# Patient Record
Sex: Female | Born: 1955 | Race: White | Hispanic: No | Smoking: Former smoker
Health system: Southern US, Community
[De-identification: ages and names within clinical notes are randomized; demographics above are authoritative.]

## PROBLEM LIST (undated history)

## (undated) DIAGNOSIS — I351 Nonrheumatic aortic (valve) insufficiency: Secondary | ICD-10-CM

## (undated) DIAGNOSIS — E039 Hypothyroidism, unspecified: Secondary | ICD-10-CM

## (undated) DIAGNOSIS — Z951 Presence of aortocoronary bypass graft: Secondary | ICD-10-CM

## (undated) DIAGNOSIS — K5190 Ulcerative colitis, unspecified, without complications: Secondary | ICD-10-CM

## (undated) DIAGNOSIS — I10 Essential (primary) hypertension: Secondary | ICD-10-CM

## (undated) DIAGNOSIS — E119 Type 2 diabetes mellitus without complications: Secondary | ICD-10-CM

## (undated) DIAGNOSIS — E785 Hyperlipidemia, unspecified: Secondary | ICD-10-CM

## (undated) DIAGNOSIS — I2510 Atherosclerotic heart disease of native coronary artery without angina pectoris: Secondary | ICD-10-CM

## (undated) HISTORY — PX: FRACTURE SURGERY: SHX138

## (undated) HISTORY — DX: Essential (primary) hypertension: I10

## (undated) HISTORY — DX: Type 2 diabetes mellitus without complications: E11.9

## (undated) HISTORY — PX: TONSILLECTOMY: SUR1361

## (undated) HISTORY — DX: Hyperlipidemia, unspecified: E78.5

## (undated) HISTORY — PX: OTHER SURGICAL HISTORY: SHX169

---
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
## 1993-02-06 HISTORY — PX: TUBAL LIGATION: SHX77

## 2016-12-10 ENCOUNTER — Encounter: Payer: Self-pay | Admitting: Family Medicine

## 2016-12-10 ENCOUNTER — Ambulatory Visit (INDEPENDENT_AMBULATORY_CARE_PROVIDER_SITE_OTHER): Payer: Medicare Other | Admitting: Emergency Medicine

## 2016-12-10 VITALS — BP 132/66 | HR 69 | Temp 98.8°F | Resp 16 | Ht 68.75 in | Wt 147.0 lb

## 2016-12-10 DIAGNOSIS — Z0000 Encounter for general adult medical examination without abnormal findings: Secondary | ICD-10-CM | POA: Diagnosis not present

## 2016-12-10 DIAGNOSIS — Z8639 Personal history of other endocrine, nutritional and metabolic disease: Secondary | ICD-10-CM

## 2016-12-10 DIAGNOSIS — I10 Essential (primary) hypertension: Secondary | ICD-10-CM

## 2016-12-10 DIAGNOSIS — E785 Hyperlipidemia, unspecified: Secondary | ICD-10-CM

## 2016-12-10 NOTE — Progress Notes (Signed)
[name] 61 y.o.   Chief Complaint  Patient presents with  . Establish Care    HISTORY OF PRESENT ILLNESS: This is a 61 y.o. female here to establish care. Chronic smoker; states her diabetes and hypertension are under control on no meds; was morbidly obese at one time (425 lbs) and was able to lose significant amount; has chronic back pain and was on chronic opiates for some time but off them now for 4-5 years. Has FHx of Colon CA (mother) and has had 3 colonoscopies when polys were removed; no h/o breast CA and has kept UTD with mammograms. Has no complaints or medical concerns; requesting referral to Plastic Surgeon for excess skin evaluation.  HPI   Prior to Admission medications   Not on File    Allergies not on file  There are no active problems to display for this patient.   Past Medical History:  Diagnosis Date  . Diabetes mellitus without complication ([place])   . Hyperlipidemia   . Hypertension     Past Surgical History:  Procedure Laterality Date  . FRACTURE SURGERY Right    thumb  . head surgery Left   . TONSILLECTOMY    . TUBAL LIGATION  02/1993    Social History   Social History  . Marital status: Legally Separated    Spouse name: N/A  . Number of children: N/A  . Years of education: N/A   Occupational History  . Not on file.   Social History Main Topics  . Smoking status: Current Every Day Smoker  . Smokeless tobacco: Never Used  . Alcohol use No  . Drug use: No  . Sexual activity: Not on file   Other Topics Concern  . Not on file   Social History Narrative  . No narrative on file    Family History  Problem Relation Age of Onset  . Cancer Mother        colon and bone     Review of Systems  Constitutional: Positive for weight loss (planned). Negative for chills, fever and malaise/fatigue.  HENT: Negative.  Negative for hearing loss and nosebleeds.   Eyes: Negative.  Negative for blurred vision, double vision, discharge and  redness.  Respiratory: Negative.  Negative for cough and shortness of breath.   Cardiovascular: Negative.  Negative for chest pain, claudication and leg swelling.  Gastrointestinal: Negative.  Negative for abdominal pain, diarrhea, nausea and vomiting.  Genitourinary: Negative.  Negative for dysuria and hematuria.  Musculoskeletal: Positive for back pain (chronic) and neck pain. Negative for myalgias.  Skin: Negative for rash.  Neurological: Negative.  Negative for dizziness, sensory change, focal weakness and headaches.  Endo/Heme/Allergies: Negative.   All other systems reviewed and are negative.  Vitals:   12/10/16 1351 12/10/16 1358  BP: (!) 146/78 132/66  Pulse: 69   Resp: 16   Temp: 98.8 F (37.1 C)   SpO2: 100%      Physical Exam  Constitutional: She is oriented to person, place, and time. She appears well-developed and well-nourished.  HENT:  Head: Normocephalic and atraumatic.  Right Ear: External ear normal.  Left Ear: External ear normal.  Nose: Nose normal.  Mouth/Throat: Oropharynx is clear and moist.  Eyes: Pupils are equal, round, and reactive to light. Conjunctivae and EOM are normal.  Neck: Normal range of motion. Neck supple. No JVD present. No thyromegaly present.  Cardiovascular: Normal rate, regular rhythm, normal heart sounds and intact distal pulses.   Pulmonary/Chest: Effort normal  and breath sounds normal.  Abdominal: Soft. Bowel sounds are normal. She exhibits no distension. There is no tenderness.  Musculoskeletal: Normal range of motion.  Lymphadenopathy:    She has no cervical adenopathy.  Neurological: She is alert and oriented to person, place, and time. No sensory deficit. She exhibits normal muscle tone.  Skin: Skin is warm and dry. Capillary refill takes less than 2 seconds. No rash noted.  Psychiatric: She has a normal mood and affect. Her behavior is normal.     ASSESSMENT & PLAN:  Ameriah was seen today for establish care.  Diagnoses  and all orders for this visit:  Routine general medical examination at a health care facility -     CBC with Differential -     Comprehensive metabolic panel -     TSH -     Lipid panel -     Hepatitis C antibody screen -     Ambulatory referral to Plastic Surgery   Patient Instructions       IF you received an x-ray today, you will receive an invoice from [facility]. Please contact [facility] at [phone] with questions or concerns regarding your invoice.   IF you received labwork today, you will receive an invoice from [place]. Please contact [facility] at [phone] with questions or concerns regarding your invoice.   Our billing staff will not be able to assist you with questions regarding bills from these companies.  You will be contacted with the lab results as soon as they are available. The fastest way to get your results is to activate your My Chart account. Instructions are located on the last page of this paperwork. If you have not heard from [place] regarding the results in 2 weeks, please contact this office.      Health Maintenance, Female Adopting a healthy lifestyle and getting preventive care can go a long way to promote health and wellness. Talk with your health care provider about what schedule of regular examinations is right for you. This is a good chance for you to check in with your provider about disease prevention and staying healthy. In between checkups, there are plenty of things you can do on your own. Experts have done a lot of research about which lifestyle changes and preventive measures are most likely to keep you healthy. Ask your health care provider for more information. Weight and diet Eat a healthy diet  Be sure to include plenty of vegetables, fruits, low-fat dairy products, and lean protein.  Do not eat a lot of foods high in solid fats, added sugars, or salt.  Get regular exercise. This is one of the most important  things you can do for your health. ? Most adults should exercise for at least 150 minutes each week. The exercise should increase your heart rate and make you sweat (moderate-intensity exercise). ? Most adults should also do strengthening exercises at least twice a week. This is in addition to the moderate-intensity exercise.  Maintain a healthy weight  Body mass index (BMI) is a measurement that can be used to identify possible weight problems. It estimates body fat based on height and weight. Your health care provider can help determine your BMI and help you achieve or maintain a healthy weight.  For females 8 years of age and older: ? A BMI below 18.5 is considered underweight. ? A BMI of 18.5 to 24.9 is normal. ? A BMI of 25 to 29.9 is considered overweight. ? A BMI  of 30 and above is considered obese.  Watch levels of cholesterol and blood lipids  You should start having your blood tested for lipids and cholesterol at 59 years of age, then have this test every 5 years.  You may need to have your cholesterol levels checked more often if: ? Your lipid or cholesterol levels are high. ? You are older than 59 years of age. ? You are at high risk for heart disease.  Cancer screening Lung Cancer  Lung cancer screening is recommended for adults 80-61 years old who are at high risk for lung cancer because of a history of smoking.  A yearly low-dose CT scan of the lungs is recommended for people who: ? Currently smoke. ? Have quit within the past 15 years. ? Have at least a 30-pack-year history of smoking. A pack year is smoking an average of one pack of cigarettes a day for 1 year.  Yearly screening should continue until it has been 15 years since you quit.  Yearly screening should stop if you develop a health problem that would prevent you from having lung cancer treatment.  Breast Cancer  Practice breast self-awareness. This means understanding how your breasts normally appear  and feel.  It also means doing regular breast self-exams. Let your health care provider know about any changes, no matter how small.  If you are in your 20s or 30s, you should have a clinical breast exam (CBE) by a health care provider every 1-3 years as part of a regular health exam.  If you are 66 or older, have a CBE every year. Also consider having a breast X-ray (mammogram) every year.  If you have a family history of breast cancer, talk to your health care provider about genetic screening.  If you are at high risk for breast cancer, talk to your health care provider about having an MRI and a mammogram every year.  Breast cancer gene (BRCA) assessment is recommended for women who have family members with BRCA-related cancers. BRCA-related cancers include: ? Breast. ? Ovarian. ? Tubal. ? Peritoneal cancers.  Results of the assessment will determine the need for genetic counseling and BRCA1 and BRCA2 testing.  Cervical Cancer Your health care provider may recommend that you be screened regularly for cancer of the pelvic organs (ovaries, uterus, and vagina). This screening involves a pelvic examination, including checking for microscopic changes to the surface of your cervix (Pap test). You may be encouraged to have this screening done every 3 years, beginning at age 34.  For women ages 58-65, health care providers may recommend pelvic exams and Pap testing every 3 years, or they may recommend the Pap and pelvic exam, combined with testing for human papilloma virus (HPV), every 5 years. Some types of HPV increase your risk of cervical cancer. Testing for HPV may also be done on women of any age with unclear Pap test results.  Other health care providers may not recommend any screening for nonpregnant women who are considered low risk for pelvic cancer and who do not have symptoms. Ask your health care provider if a screening pelvic exam is right for you.  If you have had past treatment  for cervical cancer or a condition that could lead to cancer, you need Pap tests and screening for cancer for at least 20 years after your treatment. If Pap tests have been discontinued, your risk factors (such as having a new sexual partner) need to be reassessed to determine if screening should  resume. Some women have medical problems that increase the chance of getting cervical cancer. In these cases, your health care provider may recommend more frequent screening and Pap tests.  Colorectal Cancer  This type of cancer can be detected and often prevented.  Routine colorectal cancer screening usually begins at 59 years of age and continues through 59 years of age.  Your health care provider may recommend screening at an earlier age if you have risk factors for colon cancer.  Your health care provider may also recommend using home test kits to check for hidden blood in the stool.  A small camera at the end of a tube can be used to examine your colon directly (sigmoidoscopy or colonoscopy). This is done to check for the earliest forms of colorectal cancer.  Routine screening usually begins at age 74.  Direct examination of the colon should be repeated every 5-10 years through 59 years of age. However, you may need to be screened more often if early forms of precancerous polyps or small growths are found.  Skin Cancer  Check your skin from head to toe regularly.  Tell your health care provider about any new moles or changes in moles, especially if there is a change in a mole's shape or color.  Also tell your health care provider if you have a mole that is larger than the size of a pencil eraser.  Always use sunscreen. Apply sunscreen liberally and repeatedly throughout the day.  Protect yourself by wearing long sleeves, pants, a wide-brimmed hat, and sunglasses whenever you are outside.  Heart disease, diabetes, and high blood pressure  High blood pressure causes heart disease and  increases the risk of stroke. High blood pressure is more likely to develop in: ? People who have blood pressure in the high end of the normal range (130-139/85-89 mm Hg). ? People who are overweight or obese. ? People who are African American.  If you are 65-38 years of age, have your blood pressure checked every 3-5 years. If you are 10 years of age or older, have your blood pressure checked every year. You should have your blood pressure measured twice-once when you are at a hospital or clinic, and once when you are not at a hospital or clinic. Record the average of the two measurements. To check your blood pressure when you are not at a hospital or clinic, you can use: ? An automated blood pressure machine at a pharmacy. ? A home blood pressure monitor.  If you are between 67 years and 31 years old, ask your health care provider if you should take aspirin to prevent strokes.  Have regular diabetes screenings. This involves taking a blood sample to check your fasting blood sugar level. ? If you are at a normal weight and have a low risk for diabetes, have this test once every three years after 59 years of age. ? If you are overweight and have a high risk for diabetes, consider being tested at a younger age or more often. Preventing infection Hepatitis B  If you have a higher risk for hepatitis B, you should be screened for this virus. You are considered at high risk for hepatitis B if: ? You were born in a country where hepatitis B is common. Ask your health care provider which countries are considered high risk. ? Your parents were born in a high-risk country, and you have not been immunized against hepatitis B (hepatitis B vaccine). ? You have HIV or AIDS. ?  You use needles to inject street drugs. ? You live with someone who has hepatitis B. ? You have had sex with someone who has hepatitis B. ? You get hemodialysis treatment. ? You take certain medicines for conditions, including  cancer, organ transplantation, and autoimmune conditions.  Hepatitis C  Blood testing is recommended for: ? Everyone born from [birth year] through 1963. ? Anyone with known risk factors for hepatitis C.  Sexually transmitted infections (STIs)  You should be screened for sexually transmitted infections (STIs) including gonorrhea and chlamydia if: ? You are sexually active and are younger than 59 years of age. ? You are older than 59 years of age and your health care provider tells you that you are at risk for this type of infection. ? Your sexual activity has changed since you were last screened and you are at an increased risk for chlamydia or gonorrhea. Ask your health care provider if you are at risk.  If you do not have HIV, but are at risk, it may be recommended that you take a prescription medicine daily to prevent HIV infection. This is called pre-exposure prophylaxis (PrEP). You are considered at risk if: ? You are sexually active and do not regularly use condoms or know the HIV status of your partner(s). ? You take drugs by injection. ? You are sexually active with a partner who has HIV.  Talk with your health care provider about whether you are at high risk of being infected with HIV. If you choose to begin PrEP, you should first be tested for HIV. You should then be tested every 3 months for as long as you are taking PrEP. Pregnancy  If you are premenopausal and you may become pregnant, ask your health care provider about preconception counseling.  If you may become pregnant, take 400 to 800 micrograms (mcg) of folic acid every day.  If you want to prevent pregnancy, talk to your health care provider about birth control (contraception). Osteoporosis and menopause  Osteoporosis is a disease in which the bones lose minerals and strength with aging. This can result in serious bone fractures. Your risk for osteoporosis can be identified using a bone density scan.  If you are 36 years  of age or older, or if you are at risk for osteoporosis and fractures, ask your health care provider if you should be screened.  Ask your health care provider whether you should take a calcium or vitamin D supplement to lower your risk for osteoporosis.  Menopause may have certain physical symptoms and risks.  Hormone replacement therapy may reduce some of these symptoms and risks. Talk to your health care provider about whether hormone replacement therapy is right for you. Follow these instructions at home:  Schedule regular health, dental, and eye exams.  Stay current with your immunizations.  Do not use any tobacco products including cigarettes, chewing tobacco, or electronic cigarettes.  If you are pregnant, do not drink alcohol.  If you are breastfeeding, limit how much and how often you drink alcohol.  Limit alcohol intake to no more than 1 drink per day for nonpregnant women. One drink equals 12 ounces of beer, 5 ounces of wine, or 1 ounces of hard liquor.  Do not use street drugs.  Do not share needles.  Ask your health care provider for help if you need support or information about quitting drugs.  Tell your health care provider if you often feel depressed.  Tell your health care provider if you have  ever been abused or do not feel safe at home. This information is not intended to replace advice given to you by your health care provider. Make sure you discuss any questions you have with your health care provider. Document Released: 10/08/2010 Document Revised: 08/31/2015 Document Reviewed: 12/27/2014 Elsevier Interactive Patient Education  2018 [place] (AHA) Exercise Recommendation  Being physically active is important to prevent heart disease and stroke, the nation's No. 1and No. 5killers. To improve overall cardiovascular health, we suggest at least 150 minutes per week of moderate exercise or 75 minutes per week of vigorous exercise  (or a combination of moderate and vigorous activity). Thirty minutes a day, five times a week is an easy goal to remember. You will also experience benefits even if you divide your time into two or three segments of 10 to 15 minutes per day.  For people who would benefit from lowering their blood pressure or cholesterol, we recommend 40 minutes of aerobic exercise of moderate to vigorous intensity three to four times a week to lower the risk for heart attack and stroke.  Physical activity is anything that makes you move your body and burn calories.  This includes things like climbing stairs or playing sports. Aerobic exercises benefit your heart, and include walking, jogging, swimming or biking. Strength and stretching exercises are best for overall stamina and flexibility.  The simplest, positive change you can make to effectively improve your heart health is to start walking. It's enjoyable, free, easy, social and great exercise. A walking program is flexible and boasts high success rates because people can stick with it. It's easy for walking to become a regular and satisfying part of life.   For Overall Cardiovascular Health:  At least 30 minutes of moderate-intensity aerobic activity at least 5 days per week for a total of 150  OR   At least 25 minutes of vigorous aerobic activity at least 3 days per week for a total of 75 minutes; or a combination of moderate- and vigorous-intensity aerobic activity  AND   Moderate- to high-intensity muscle-strengthening activity at least 2 days per week for additional health benefits.  For Lowering Blood Pressure and Cholesterol  An average 40 minutes of moderate- to vigorous-intensity aerobic activity 3 or 4 times per week  What if I can't make it to the time goal? Something is always better than nothing! And everyone has to start somewhere. Even if you've been sedentary for years, today is the day you can begin to make healthy changes in your  life. If you don't think you'll make it for 30 or 40 minutes, set a reachable goal for today. You can work up toward your overall goal by increasing your time as you get stronger. Don't let all-or-nothing thinking rob you of doing what you can every day.  Source:[email or web address].[email or web address] [email or web address] MD [facility]

## 2016-12-10 NOTE — Patient Instructions (Addendum)
   IF you received an x-ray today, you will receive an invoice from [facility]. Please contact [facility] at [phone] with questions or concerns regarding your invoice.   IF you received labwork today, you will receive an invoice from [facility]. Please contact [facility] at [phone] with questions or concerns regarding your invoice.   Our billing staff will not be able to assist you with questions regarding bills from these companies.  You will be contacted with the lab results as soon as they are available. The fastest way to get your results is to activate your My Chart account. Instructions are located on the last page of this paperwork. If you have not heard from us regarding the results in 2 weeks, please contact this office.     Health Maintenance, Female Adopting a healthy lifestyle and getting preventive care can go a long way to promote health and wellness. Talk with your health care provider about what schedule of regular examinations is right for you. This is a good chance for you to check in with your provider about disease prevention and staying healthy. In between checkups, there are plenty of things you can do on your own. Experts have done a lot of research about which lifestyle changes and preventive measures are most likely to keep you healthy. Ask your health care provider for more information. Weight and diet Eat a healthy diet  Be sure to include plenty of vegetables, fruits, low-fat dairy products, and lean protein.  Do not eat a lot of foods high in solid fats, added sugars, or salt.  Get regular exercise. This is one of the most important things you can do for your health. ? Most adults should exercise for at least 150 minutes each week. The exercise should increase your heart rate and make you sweat (moderate-intensity exercise). ? Most adults should also do strengthening exercises at least twice a week. This is in addition to the  moderate-intensity exercise.  Maintain a healthy weight  Body mass index (BMI) is a measurement that can be used to identify possible weight problems. It estimates body fat based on height and weight. Your health care provider can help determine your BMI and help you achieve or maintain a healthy weight.  For females 20 years of age and older: ? A BMI below 18.5 is considered underweight. ? A BMI of 18.5 to 24.9 is normal. ? A BMI of 25 to 29.9 is considered overweight. ? A BMI of 30 and above is considered obese.  Watch levels of cholesterol and blood lipids  You should start having your blood tested for lipids and cholesterol at 59 years of age, then have this test every 5 years.  You may need to have your cholesterol levels checked more often if: ? Your lipid or cholesterol levels are high. ? You are older than 59 years of age. ? You are at high risk for heart disease.  Cancer screening Lung Cancer  Lung cancer screening is recommended for adults 55-80 years old who are at high risk for lung cancer because of a history of smoking.  A yearly low-dose CT scan of the lungs is recommended for people who: ? Currently smoke. ? Have quit within the past 15 years. ? Have at least a 30-pack-year history of smoking. A pack year is smoking an average of one pack of cigarettes a day for 1 year.  Yearly screening should continue until it has been 15 years since you quit.  Yearly   screening should stop if you develop a health problem that would prevent you from having lung cancer treatment.  Breast Cancer  Practice breast self-awareness. This means understanding how your breasts normally appear and feel.  It also means doing regular breast self-exams. Let your health care provider know about any changes, no matter how small.  If you are in your 20s or 30s, you should have a clinical breast exam (CBE) by a health care provider every 1-3 years as part of a regular health exam.  If you  are 40 or older, have a CBE every year. Also consider having a breast X-ray (mammogram) every year.  If you have a family history of breast cancer, talk to your health care provider about genetic screening.  If you are at high risk for breast cancer, talk to your health care provider about having an MRI and a mammogram every year.  Breast cancer gene (BRCA) assessment is recommended for women who have family members with BRCA-related cancers. BRCA-related cancers include: ? Breast. ? Ovarian. ? Tubal. ? Peritoneal cancers.  Results of the assessment will determine the need for genetic counseling and BRCA1 and BRCA2 testing.  Cervical Cancer Your health care provider may recommend that you be screened regularly for cancer of the pelvic organs (ovaries, uterus, and vagina). This screening involves a pelvic examination, including checking for microscopic changes to the surface of your cervix (Pap test). You may be encouraged to have this screening done every 3 years, beginning at age 21.  For women ages 30-65, health care providers may recommend pelvic exams and Pap testing every 3 years, or they may recommend the Pap and pelvic exam, combined with testing for human papilloma virus (HPV), every 5 years. Some types of HPV increase your risk of cervical cancer. Testing for HPV may also be done on women of any age with unclear Pap test results.  Other health care providers may not recommend any screening for nonpregnant women who are considered low risk for pelvic cancer and who do not have symptoms. Ask your health care provider if a screening pelvic exam is right for you.  If you have had past treatment for cervical cancer or a condition that could lead to cancer, you need Pap tests and screening for cancer for at least 20 years after your treatment. If Pap tests have been discontinued, your risk factors (such as having a new sexual partner) need to be reassessed to determine if screening should  resume. Some women have medical problems that increase the chance of getting cervical cancer. In these cases, your health care provider may recommend more frequent screening and Pap tests.  Colorectal Cancer  This type of cancer can be detected and often prevented.  Routine colorectal cancer screening usually begins at 59 years of age and continues through 59 years of age.  Your health care provider may recommend screening at an earlier age if you have risk factors for colon cancer.  Your health care provider may also recommend using home test kits to check for hidden blood in the stool.  A small camera at the end of a tube can be used to examine your colon directly (sigmoidoscopy or colonoscopy). This is done to check for the earliest forms of colorectal cancer.  Routine screening usually begins at age 50.  Direct examination of the colon should be repeated every 5-10 years through 59 years of age. However, you may need to be screened more often if early forms of precancerous polyps   or small growths are found.  Skin Cancer  Check your skin from head to toe regularly.  Tell your health care provider about any new moles or changes in moles, especially if there is a change in a mole's shape or color.  Also tell your health care provider if you have a mole that is larger than the size of a pencil eraser.  Always use sunscreen. Apply sunscreen liberally and repeatedly throughout the day.  Protect yourself by wearing long sleeves, pants, a wide-brimmed hat, and sunglasses whenever you are outside.  Heart disease, diabetes, and high blood pressure  High blood pressure causes heart disease and increases the risk of stroke. High blood pressure is more likely to develop in: ? People who have blood pressure in the high end of the normal range (130-139/85-89 mm Hg). ? People who are overweight or obese. ? People who are African American.  If you are 18-39 years of age, have your blood  pressure checked every 3-5 years. If you are 40 years of age or older, have your blood pressure checked every year. You should have your blood pressure measured twice-once when you are at a hospital or clinic, and once when you are not at a hospital or clinic. Record the average of the two measurements. To check your blood pressure when you are not at a hospital or clinic, you can use: ? An automated blood pressure machine at a pharmacy. ? A home blood pressure monitor.  If you are between 55 years and 79 years old, ask your health care provider if you should take aspirin to prevent strokes.  Have regular diabetes screenings. This involves taking a blood sample to check your fasting blood sugar level. ? If you are at a normal weight and have a low risk for diabetes, have this test once every three years after 59 years of age. ? If you are overweight and have a high risk for diabetes, consider being tested at a younger age or more often. Preventing infection Hepatitis B  If you have a higher risk for hepatitis B, you should be screened for this virus. You are considered at high risk for hepatitis B if: ? You were born in a country where hepatitis B is common. Ask your health care provider which countries are considered high risk. ? Your parents were born in a high-risk country, and you have not been immunized against hepatitis B (hepatitis B vaccine). ? You have HIV or AIDS. ? You use needles to inject street drugs. ? You live with someone who has hepatitis B. ? You have had sex with someone who has hepatitis B. ? You get hemodialysis treatment. ? You take certain medicines for conditions, including cancer, organ transplantation, and autoimmune conditions.  Hepatitis C  Blood testing is recommended for: ? Everyone born from 1945 through 1965. ? Anyone with known risk factors for hepatitis C.  Sexually transmitted infections (STIs)  You should be screened for sexually transmitted  infections (STIs) including gonorrhea and chlamydia if: ? You are sexually active and are younger than 59 years of age. ? You are older than 59 years of age and your health care provider tells you that you are at risk for this type of infection. ? Your sexual activity has changed since you were last screened and you are at an increased risk for chlamydia or gonorrhea. Ask your health care provider if you are at risk.  If you do not have HIV, but are at risk,   risk, it may be recommended that you take a prescription medicine daily to prevent HIV infection. This is called pre-exposure prophylaxis (PrEP). You are considered at risk if: ? You are sexually active and do not regularly use condoms or know the HIV status of your partner(s). ? You take drugs by injection. ? You are sexually active with a partner who has HIV.  Talk with your health care provider about whether you are at high risk of being infected with HIV. If you choose to begin PrEP, you should first be tested for HIV. You should then be tested every 3 months for as long as you are taking PrEP. Pregnancy  If you are premenopausal and you may become pregnant, ask your health care provider about preconception counseling.  If you may become pregnant, take 400 to 800 micrograms (mcg) of folic acid every day.  If you want to prevent pregnancy, talk to your health care provider about birth control (contraception). Osteoporosis and menopause  Osteoporosis is a disease in which the bones lose minerals and strength with aging. This can result in serious bone fractures. Your risk for osteoporosis can be identified using a bone density scan.  If you are 70 years of age or older, or if you are at risk for osteoporosis and fractures, ask your health care provider if you should be screened.  Ask your health care provider whether you should take a calcium or vitamin D supplement to lower your risk for osteoporosis.  Menopause may have certain physical  symptoms and risks.  Hormone replacement therapy may reduce some of these symptoms and risks. Talk to your health care provider about whether hormone replacement therapy is right for you. Follow these instructions at home:  Schedule regular health, dental, and eye exams.  Stay current with your immunizations.  Do not use any tobacco products including cigarettes, chewing tobacco, or electronic cigarettes.  If you are pregnant, do not drink alcohol.  If you are breastfeeding, limit how much and how often you drink alcohol.  Limit alcohol intake to no more than 1 drink per day for nonpregnant women. One drink equals 12 ounces of beer, 5 ounces of wine, or 1 ounces of hard liquor.  Do not use street drugs.  Do not share needles.  Ask your health care provider for help if you need support or information about quitting drugs.  Tell your health care provider if you often feel depressed.  Tell your health care provider if you have ever been abused or do not feel safe at home. This information is not intended to replace advice given to you by your health care provider. Make sure you discuss any questions you have with your health care provider. Document Released: 10/08/2010 Document Revised: 08/31/2015 Document Reviewed: 12/27/2014 Elsevier Interactive Patient Education  2018 [place] (AHA) Exercise Recommendation  Being physically active is important to prevent heart disease and stroke, the nation's No. 1and No. 5killers. To improve overall cardiovascular health, we suggest at least 150 minutes per week of moderate exercise or 75 minutes per week of vigorous exercise (or a combination of moderate and vigorous activity). Thirty minutes a day, five times a week is an easy goal to remember. You will also experience benefits even if you divide your time into two or three segments of 10 to 15 minutes per day.  For people who would benefit from lowering their  blood pressure or cholesterol, we recommend 40 minutes of aerobic exercise of moderate  vigorous intensity three to four times a week to lower the risk for heart attack and stroke.  Physical activity is anything that makes you move your body and burn calories.  This includes things like climbing stairs or playing sports. Aerobic exercises benefit your heart, and include walking, jogging, swimming or biking. Strength and stretching exercises are best for overall stamina and flexibility.  The simplest, positive change you can make to effectively improve your heart health is to start walking. It's enjoyable, free, easy, social and great exercise. A walking program is flexible and boasts high success rates because people can stick with it. It's easy for walking to become a regular and satisfying part of life.   For Overall Cardiovascular Health:  At least 30 minutes of moderate-intensity aerobic activity at least 5 days per week for a total of 150  OR   At least 25 minutes of vigorous aerobic activity at least 3 days per week for a total of 75 minutes; or a combination of moderate- and vigorous-intensity aerobic activity  AND   Moderate- to high-intensity muscle-strengthening activity at least 2 days per week for additional health benefits.  For Lowering Blood Pressure and Cholesterol  An average 40 minutes of moderate- to vigorous-intensity aerobic activity 3 or 4 times per week  What if I can't make it to the time goal? Something is always better than nothing! And everyone has to start somewhere. Even if you've been sedentary for years, today is the day you can begin to make healthy changes in your life. If you don't think you'll make it for 30 or 40 minutes, set a reachable goal for today. You can work up toward your overall goal by increasing your time as you get stronger. Don't let all-or-nothing thinking rob you of doing what you can every day.  Source:http://www.heart.org    

## 2016-12-11 LAB — COMPREHENSIVE METABOLIC PANEL
ALBUMIN: 4.3 g/dL (ref 3.5–5.5)
ALT: 7 IU/L (ref 0–32)
AST: 12 IU/L (ref 0–40)
Albumin/Globulin Ratio: 1.7 (ref 1.2–2.2)
Alkaline Phosphatase: 143 IU/L — ABNORMAL HIGH (ref 39–117)
BUN / CREAT RATIO: 18 (ref 9–23)
BUN: 16 mg/dL (ref 6–24)
Bilirubin Total: 0.3 mg/dL (ref 0.0–1.2)
CO2: 20 mmol/L (ref 20–29)
CREATININE: 0.91 mg/dL (ref 0.57–1.00)
Calcium: 9.8 mg/dL (ref 8.7–10.2)
Chloride: 106 mmol/L (ref 96–106)
GFR calc Af Amer: 80 mL/min/{1.73_m2} (ref >59)
GFR calc non Af Amer: 69 mL/min/{1.73_m2} (ref >59)
GLUCOSE: 101 mg/dL — AB (ref 65–99)
Globulin, Total: 2.6 g/dL (ref 1.5–4.5)
Potassium: 4.1 mmol/L (ref 3.5–5.2)
Sodium: 142 mmol/L (ref 134–144)
Total Protein: 6.9 g/dL (ref 6.0–8.5)

## 2016-12-11 LAB — CBC WITH DIFFERENTIAL/PLATELET
BASOS ABS: 0.1 10*3/uL (ref 0.0–0.2)
BASOS: 1 %
EOS (ABSOLUTE): 0.2 10*3/uL (ref 0.0–0.4)
EOS: 3 %
HEMOGLOBIN: 13.2 g/dL (ref 11.1–15.9)
Hematocrit: 40.7 % (ref 34.0–46.6)
Immature Grans (Abs): 0 10*3/uL (ref 0.0–0.1)
Immature Granulocytes: 0 %
Lymphocytes Absolute: 3.8 10*3/uL — ABNORMAL HIGH (ref 0.7–3.1)
Lymphs: 44 %
MCH: 30.5 pg (ref 26.6–33.0)
MCHC: 32.4 g/dL (ref 31.5–35.7)
MCV: 94 fL (ref 79–97)
MONOS ABS: 0.3 10*3/uL (ref 0.1–0.9)
Monocytes: 4 %
NEUTROS ABS: 4.2 10*3/uL (ref 1.4–7.0)
Neutrophils: 48 %
Platelets: 233 10*3/uL (ref 150–379)
RBC: 4.33 x10E6/uL (ref 3.77–5.28)
RDW: 13.4 % (ref 12.3–15.4)
WBC: 8.7 10*3/uL (ref 3.4–10.8)

## 2016-12-11 LAB — LIPID PANEL
CHOLESTEROL TOTAL: 158 mg/dL (ref 100–199)
Chol/HDL Ratio: 3 ratio (ref 0.0–4.4)
HDL: 52 mg/dL (ref >39)
LDL Calculated: 84 mg/dL (ref 0–99)
TRIGLYCERIDES: 109 mg/dL (ref 0–149)
VLDL CHOLESTEROL CAL: 22 mg/dL (ref 5–40)

## 2016-12-11 LAB — HEPATITIS C ANTIBODY: Hep C Virus Ab: <0.1 s/co ratio (ref 0.0–0.9)

## 2016-12-11 LAB — TSH: TSH: 0.024 u[IU]/mL — AB (ref 0.450–4.500)

## 2016-12-12 ENCOUNTER — Encounter: Payer: Self-pay | Admitting: Radiology

## 2018-10-13 ENCOUNTER — Other Ambulatory Visit: Payer: Self-pay | Admitting: Internal Medicine

## 2018-10-13 DIAGNOSIS — Z122 Encounter for screening for malignant neoplasm of respiratory organs: Secondary | ICD-10-CM

## 2018-10-26 ENCOUNTER — Other Ambulatory Visit: Payer: Self-pay

## 2018-10-26 ENCOUNTER — Ambulatory Visit
Admission: RE | Admit: 2018-10-26 | Discharge: 2018-10-26 | Disposition: A | Discharge status: Home / Self Care | Payer: Medicare Other | Source: Ambulatory Visit | Attending: Internal Medicine | Admitting: Internal Medicine

## 2018-10-26 DIAGNOSIS — Z122 Encounter for screening for malignant neoplasm of respiratory organs: Secondary | ICD-10-CM

## 2018-11-04 ENCOUNTER — Telehealth: Payer: Self-pay | Admitting: Cardiology

## 2018-11-04 NOTE — Telephone Encounter (Signed)
I have left patient a message to call back to schedule an appt

## 2018-11-04 NOTE — Telephone Encounter (Signed)
-----   Message from Adrian Prows, MD sent at 11/04/2018  8:15 AM EDT ----- Regarding: Chest pain and needs new patient visit soon

## 2018-11-09 ENCOUNTER — Encounter: Payer: Self-pay | Admitting: Cardiology

## 2018-11-10 ENCOUNTER — Ambulatory Visit (INDEPENDENT_AMBULATORY_CARE_PROVIDER_SITE_OTHER): Payer: Medicare Other | Admitting: Cardiology

## 2018-11-10 ENCOUNTER — Other Ambulatory Visit: Payer: Self-pay

## 2018-11-10 ENCOUNTER — Encounter: Payer: Self-pay | Admitting: Cardiology

## 2018-11-10 VITALS — BP 146/91 | HR 85 | Ht 66.0 in | Wt 184.7 lb

## 2018-11-10 DIAGNOSIS — R079 Chest pain, unspecified: Secondary | ICD-10-CM

## 2018-11-10 DIAGNOSIS — R0789 Other chest pain: Secondary | ICD-10-CM | POA: Diagnosis not present

## 2018-11-10 DIAGNOSIS — I10 Essential (primary) hypertension: Secondary | ICD-10-CM | POA: Diagnosis not present

## 2018-11-10 MED ORDER — METOPROLOL SUCCINATE ER 25 MG PO TB24: 25.0000 mg | ORAL_TABLET | Freq: Every day | ORAL | 1 refills | Status: DC

## 2018-11-10 NOTE — Progress Notes (Addendum)
Subjective:  Primary Physician:  Audley Hose, MD  Patient ID: Christine Skinner, female    DOB: 1956-02-01, 63 y.o.   MRN: [id number]  Chief Complaint  Patient presents with  . Chest Pain  . New Patient (Initial Visit)    HPI: Christine Skinner  is a 63 y.o. female Patient is complaining of substernal chest pain off and on for past couple of months.  The pain occurs 2-3 times a week, mostly in the evening while she is resting after doing the housework.  The pain radiates to both the arms, no radiation to neck or back.  She describes this as pressure like sensation and she also has some funny feeling in the arms.  There is no associated dyspnea. Once, she had diaphoresis, nausea and vomiting.  Nitroglycerin seems to help relieve the pain.  Without nitroglycerin, the pain usually last for a few hours. Patient had peptic ulcer in the past, she says that these symptoms do not appear like the ulcer symptoms.  She denies any heartburn or acid reflux but has been taking Prilosec therapy.  There is no abdominal pain.  No complaints of shortness of breath, orthopnea or PND.  No history of palpitation, dizziness, near-syncope or syncope.  No history of swelling on the legs and no claudication.  Patient has diabetes mellitus type 2, hypertension and hypercholesterolemia.  She has a long history of smoking but patient says that she has quit smoking 4 days ago. Her father had CABG around age 51.   Patient has history of hypothyroidism and ulcerative colitis.  She also has history of arthritis. No history of TIA or CVA.  Past Medical History:  Diagnosis Date  . Diabetes mellitus without complication ([place])   . Hyperlipidemia   . Hypertension     Past Surgical History:  Procedure Laterality Date  . FRACTURE SURGERY Right    thumb  . head surgery Left   . TONSILLECTOMY    . TUBAL LIGATION  02/1993    Social History   Socioeconomic History  . Marital status: Divorced    Spouse name: Not  on file  . Number of children: 2  . Years of education: Not on file  . Highest education level: Not on file  Occupational History  . Not on file  Social Needs  . Financial resource strain: Not on file  . Food insecurity    Worry: Not on file    Inability: Not on file  . Transportation needs    Medical: Not on file    Non-medical: Not on file  Tobacco Use  . Smoking status: Former Smoker    Quit date: 11/07/2018  . Smokeless tobacco: Never Used  Substance and Sexual Activity  . Alcohol use: No  . Drug use: No  . Sexual activity: Not on file  Lifestyle  . Physical activity    Days per week: Not on file    Minutes per session: Not on file  . Stress: Not on file  Relationships  . Social Herbalist on phone: Not on file    Gets together: Not on file    Attends religious service: Not on file    Active member of club or organization: Not on file    Attends meetings of clubs or organizations: Not on file    Relationship status: Not on file  . Intimate partner violence    Fear of current or ex partner: Not on file  Emotionally abused: Not on file    Physically abused: Not on file    Forced sexual activity: Not on file  Other Topics Concern  . Not on file  Social History Narrative  . Not on file    Current Outpatient Medications on File Prior to Visit  Medication Sig Dispense Refill  . aspirin EC 81 MG tablet Take 81 mg by mouth daily.    Marland Kitchen. atorvastatin (LIPITOR) 40 MG tablet Take 1 tablet by mouth daily.    . fluticasone (FLONASE) 50 MCG/ACT nasal spray fluticasone propionate 50 mcg/actuation nasal spray,suspension  Spray 1 spray every day by intranasal route.    Marland Kitchen. levothyroxine (SYNTHROID) 150 MCG tablet Take by mouth.    Marland Kitchen. lisinopril (ZESTRIL) 20 MG tablet lisinopril 20 mg tablet  Take 1 tablet every day by oral route in the morning for 90 days.    . metFORMIN (GLUCOPHAGE) 500 MG tablet daily.    . nitroGLYCERIN (NITROSTAT) 0.4 MG SL tablet     . omeprazole  (PRILOSEC) 20 MG capsule     . levothyroxine (SYNTHROID) 100 MCG tablet levothyroxine 100 mcg tablet     No current facility-administered medications on file prior to visit.     Review of Systems  Constitutional: Negative for fever.  HENT: Negative for nosebleeds.   Eyes: Negative for blurred vision.  Respiratory: Negative for cough and shortness of breath.   Cardiovascular: Positive for chest pain. Negative for palpitations, orthopnea, claudication and leg swelling.  Gastrointestinal: Negative for abdominal pain, nausea and vomiting.  Genitourinary: Negative for dysuria.  Musculoskeletal: Negative for myalgias.  Skin: Negative for itching and rash.  Neurological: Negative for dizziness, seizures and loss of consciousness.  Psychiatric/Behavioral: The patient is not nervous/anxious.       Objective:  Blood pressure (!) 146/91, pulse 85, height 5\' 6"  (1.676 m), weight 184 lb 11.2 oz (83.8 kg), SpO2 98 %. Body mass index is 29.81 kg/m.  Physical Exam  Constitutional: She is oriented to person, place, and time. She appears well-developed and well-nourished.  HENT:  Head: Normocephalic and atraumatic.  Eyes: Conjunctivae are normal.  Neck: No thyromegaly present.  Cardiovascular: Normal rate, regular rhythm and normal heart sounds. Exam reveals no gallop.  No murmur heard. Pulses:      Carotid pulses are 2+ on the right side and 2+ on the left side.      Femoral pulses are 2+ on the right side and 2+ on the left side.      Dorsalis pedis pulses are 2+ on the right side and 2+ on the left side.       Posterior tibial pulses are 2+ on the right side and 2+ on the left side.  Pulmonary/Chest: She has no wheezes. She has no rales.  Abdominal: She exhibits no mass. There is no abdominal tenderness.  Musculoskeletal:        General: No edema.  Lymphadenopathy:    She has no cervical adenopathy.  Neurological: She is alert and oriented to person, place, and time.  Skin: Skin is warm.     CARDIAC STUDIES:  CT scan-10/27/2018- aortic and coronary atherosclerosis Left and renal adenoma-2.31.7 cm.  COPD.  Assessment & Recommendations:   1. 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  Voltage criteria for LVH  (R(aVL) exceeds 1.01 mV).  Nonspecific ST abnormality, may be due to ischemia  2. Essential Hypertension  3.  Hypercholesterolemia  Other orders - atorvastatin (LIPITOR) 40 MG tablet; Take 1 tablet by mouth daily. - fluticasone (FLONASE) 50 MCG/ACT nasal spray; fluticasone propionate 50 mcg/actuation nasal spray,suspension  Spray 1 spray every day by intranasal route. - levothyroxine (SYNTHROID) 150 MCG tablet; Take by mouth. - lisinopril (ZESTRIL) 20 MG tablet; lisinopril 20 mg tablet  Take 1 tablet every day by oral route in the morning for 90 days. - metFORMIN (GLUCOPHAGE) 500 MG tablet; daily. - nitroGLYCERIN (NITROSTAT) 0.4 MG SL tablet - omeprazole (PRILOSEC) 20 MG capsule - aspirin EC 81 MG tablet; Take 81 mg by mouth daily. - levothyroxine (SYNTHROID) 100 MCG tablet; levothyroxine 100 mcg tablet  Laboratory Exam:  CBC Latest Ref Rng & Units 12/10/2016  WBC 3.4 - 10.8 x10E3/uL 8.7  Hemoglobin 11.1 - 15.9 g/dL 40.913.2  Hematocrit 81.134.0 - 46.6 % 40.7  Platelets 150 - 379 x10E3/uL 233   CMP Latest Ref Rng & Units 12/10/2016  Glucose 65 - 99 mg/dL 914(N101(H)  BUN 6 - 24 mg/dL 16  Creatinine 8.290.57 - 5.621.00 mg/dL 1.300.91  Sodium 865134 - 784144 mmol/L 142  Potassium 3.5 - 5.2 mmol/L 4.1  Chloride 96 - 106 mmol/L 106  CO2 20 - 29 mmol/L 20  Calcium 8.7 - 10.2 mg/dL 9.8  Total Protein 6.0 - 8.5 g/dL 6.9  Total Bilirubin 0.0 - 1.2 mg/dL 0.3  Alkaline Phos 39 - 117 IU/L 143(H)  AST 0 - 40 IU/L 12  ALT 0 - 32 IU/L 7   10/14/2018- Glucose-99, BUN-7, creatinine-0.93, sodium-142, potassium-4.1.  Normal liver enzymes. TSH-1.2.  WBC-8.1, hemoglobin-12.8, hematocrit-37.5, platelets-215    10/05/2018 -hemoglobin A1c-5.8  Lipid Panel     Component Value Date/Time   CHOL 158 12/10/2016 1446   TRIG 109 12/10/2016 1446   HDL 52 12/10/2016 1446   CHOLHDL 3.0 12/10/2016 1446   LDLCALC 84 12/10/2016 1446   7/8//2020-cholesterol-115, HDL-70, LDL-31, triglycerides-69.  Normal liver enzymes. Recommendation:  I have discussed my assessment with the patient.  She has history of chest pressure radiating to both arms, concerning for ischemia.  Patient has multiple coronary risk factors as mentioned above in HPI.  I have advised her to continue nitroglycerin as needed for chest pain.  She was also advised that if there is  prolonged chest pain not relieved with 3 tablets of nitroglycerin or chest pain any time associated with diaphoresis, nausea etc., she should call 911 and go to the ER.  Patient was advised to continue all the present medications including low-dose aspirin.  Her blood pressure is slightly high today.  I have added metoprolol succinate for possible ischemia and high blood pressure.  Patient has been scheduled for Lexiscan Myoview scans for assessment of ischemia and will have echocardiogram to assess for any hypertensive heart disease and rule out any other structural heart disease.  Primary prevention was discussed with her.  She was advised to follow ADA, low-salt, low-cholesterol diet.  Patient was also advised to calorie restriction to lose weight.  She was complimented on her decision to quit smoking and was advised very strongly to abstain from smoking permanently.  I will see her in follow-up after the above tests.  Earl Manyhandra K Holdyn Poyser, MD, [facility] 11/10/2018, 6:23 PM [facility] Pager: [phone] Office: [phone] If no answer Cell [phone]

## 2018-11-16 ENCOUNTER — Other Ambulatory Visit: Payer: Self-pay

## 2018-11-16 ENCOUNTER — Ambulatory Visit (INDEPENDENT_AMBULATORY_CARE_PROVIDER_SITE_OTHER): Payer: Medicare Other

## 2018-11-16 DIAGNOSIS — R0789 Other chest pain: Secondary | ICD-10-CM

## 2018-11-24 ENCOUNTER — Ambulatory Visit (INDEPENDENT_AMBULATORY_CARE_PROVIDER_SITE_OTHER): Payer: Medicare Other

## 2018-11-24 ENCOUNTER — Other Ambulatory Visit: Payer: Self-pay

## 2018-11-24 DIAGNOSIS — I10 Essential (primary) hypertension: Secondary | ICD-10-CM

## 2018-11-30 DIAGNOSIS — I209 Angina pectoris, unspecified: Secondary | ICD-10-CM | POA: Insufficient documentation

## 2018-11-30 DIAGNOSIS — I351 Nonrheumatic aortic (valve) insufficiency: Secondary | ICD-10-CM | POA: Insufficient documentation

## 2018-11-30 DIAGNOSIS — E7800 Pure hypercholesterolemia, unspecified: Secondary | ICD-10-CM | POA: Insufficient documentation

## 2018-11-30 DIAGNOSIS — I10 Essential (primary) hypertension: Secondary | ICD-10-CM | POA: Insufficient documentation

## 2018-12-01 ENCOUNTER — Ambulatory Visit (INDEPENDENT_AMBULATORY_CARE_PROVIDER_SITE_OTHER): Payer: Medicare Other | Admitting: Cardiology

## 2018-12-01 ENCOUNTER — Encounter: Payer: Self-pay | Admitting: Cardiology

## 2018-12-01 ENCOUNTER — Other Ambulatory Visit: Payer: Self-pay

## 2018-12-01 DIAGNOSIS — I10 Essential (primary) hypertension: Secondary | ICD-10-CM | POA: Diagnosis not present

## 2018-12-01 DIAGNOSIS — E7800 Pure hypercholesterolemia, unspecified: Secondary | ICD-10-CM

## 2018-12-01 DIAGNOSIS — I351 Nonrheumatic aortic (valve) insufficiency: Secondary | ICD-10-CM | POA: Diagnosis not present

## 2018-12-01 DIAGNOSIS — I209 Angina pectoris, unspecified: Secondary | ICD-10-CM | POA: Diagnosis not present

## 2018-12-01 NOTE — H&P (View-Only) (Signed)
Subjective:  Primary Physician:  [name], Mobolaji B, MD  Patient ID: Christine Brightlyebbie A Heninger, female    DOB: 03/19/1956, 63 y.o.   MRN: [id number]  Chief Complaint  Patient presents with  . Hypertension  . Chest Pain  . Results    echo, nuc  . Follow-up    HPI: Christine Skinner  is a 63 y.o. female Patient is complaining of substernal chest pain off and on for past 3 months.  The pain mostly in the evening while she is resting after doing the housework.  The pain radiates to both the arms, no radiation to neck or back.  She describes this as pressure like sensation and she also has some funny feeling in the arms.  There is no associated dyspnea. Once, she had diaphoresis, nausea and vomiting.  Nitroglycerin helps relieve the pain.   Patient had peptic ulcer in the past, she says that these symptoms do not appear like the ulcer symptoms.  She denies any heartburn or acid reflux but has been taking Prilosec therapy.  There is no abdominal pain.  No complaints of shortness of breath, orthopnea or PND.  No history of palpitation, dizziness, near-syncope or syncope.  No history of swelling on the legs and no claudication.  Patient has diabetes mellitus type 2, hypertension and hypercholesterolemia. She has a long history of smoking but patient says that she quit smoking on 11/07/2018. Her father had CABG around age 61.   Patient has history of hypothyroidism and ulcerative colitis.  She also has history of arthritis. No history of TIA or CVA.  Past Medical History:  Diagnosis Date  . Diabetes mellitus without complication ([facility])   . Hyperlipidemia   . Hypertension     Past Surgical History:  Procedure Laterality Date  . FRACTURE SURGERY Right    thumb  . head surgery Left   . TONSILLECTOMY    . TUBAL LIGATION  02/1993    Social History   Socioeconomic History  . Marital status: Divorced    Spouse name: Not on file  . Number of children: 2  . Years of education: Not on file  .  Highest education level: Not on file  Occupational History  . Not on file  Social Needs  . Financial resource strain: Not on file  . Food insecurity    Worry: Not on file    Inability: Not on file  . Transportation needs    Medical: Not on file    Non-medical: Not on file  Tobacco Use  . Smoking status: Former Smoker    Packs/day: 2.00    Years: 41.00    Pack years: 82.00    Quit date: 11/07/2018    Years since quitting: 0.0  . Smokeless tobacco: Never Used  Substance and Sexual Activity  . Alcohol use: No  . Drug use: No  . Sexual activity: Not on file  Lifestyle  . Physical activity    Days per week: Not on file    Minutes per session: Not on file  . Stress: Not on file  Relationships  . Social Musicianconnections    Talks on phone: Not on file    Gets together: Not on file    Attends religious service: Not on file    Active member of club or organization: Not on file    Attends meetings of clubs or organizations: Not on file    Relationship status: Not on file  . Intimate partner violence  Fear of current or ex partner: Not on file    Emotionally abused: Not on file    Physically abused: Not on file    Forced sexual activity: Not on file  Other Topics Concern  . Not on file  Social History Narrative  . Not on file    Current Outpatient Medications on File Prior to Visit  Medication Sig Dispense Refill  . aspirin EC 81 MG tablet Take 81 mg by mouth daily.    Marland Kitchen atorvastatin (LIPITOR) 40 MG tablet Take 1 tablet by mouth daily.    . fluticasone (FLONASE) 50 MCG/ACT nasal spray fluticasone propionate 50 mcg/actuation nasal spray,suspension  Spray 1 spray every day by intranasal route.    Marland Kitchen levothyroxine (SYNTHROID) 100 MCG tablet levothyroxine 100 mcg tablet    . lisinopril (ZESTRIL) 20 MG tablet lisinopril 20 mg tablet  Take 1 tablet every day by oral route in the morning for 90 days.    . metFORMIN (GLUCOPHAGE) 500 MG tablet daily.    . metoprolol succinate  (TOPROL-XL) 25 MG 24 hr tablet Take 1 tablet (25 mg total) by mouth daily. Take with or immediately following a meal. 30 tablet 1  . nitroGLYCERIN (NITROSTAT) 0.4 MG SL tablet     . omeprazole (PRILOSEC) 20 MG capsule     . levothyroxine (SYNTHROID) 150 MCG tablet Take by mouth.     No current facility-administered medications on file prior to visit.     Review of Systems  Constitutional: Negative for fever.  HENT: Negative for nosebleeds.   Eyes: Negative for blurred vision.  Respiratory: Negative for cough and shortness of breath.   Cardiovascular: Positive for chest pain. Negative for palpitations, orthopnea, claudication and leg swelling.  Gastrointestinal: Negative for abdominal pain, nausea and vomiting.  Genitourinary: Negative for dysuria.  Musculoskeletal: Negative for myalgias.  Skin: Negative for itching and rash.  Neurological: Negative for dizziness, seizures and loss of consciousness.  Psychiatric/Behavioral: The patient is not nervous/anxious.       Objective:  Blood pressure (!) 145/81, pulse 72, temperature 97.8 F (36.6 C), height 5\' 6"  (1.676 m), weight 185 lb (83.9 kg), SpO2 99 %. Body mass index is 29.86 kg/m.  Physical Exam  Constitutional: She is oriented to person, place, and time. She appears well-developed and well-nourished.  HENT:  Head: Normocephalic and atraumatic.  Eyes: Conjunctivae are normal.  Neck: No thyromegaly present.  Cardiovascular: Normal rate, regular rhythm and normal heart sounds. Exam reveals no gallop.  No murmur heard. Pulses:      Carotid pulses are 2+ on the right side and 2+ on the left side.      Femoral pulses are 2+ on the right side and 2+ on the left side.      Dorsalis pedis pulses are 2+ on the right side and 2+ on the left side.       Posterior tibial pulses are 2+ on the right side and 2+ on the left side.  Pulmonary/Chest: She has no wheezes. She has no rales.  Abdominal: She exhibits no mass. There is no abdominal  tenderness.  Musculoskeletal:        General: No edema.  Lymphadenopathy:    She has no cervical adenopathy.  Neurological: She is alert and oriented to person, place, and time.  Skin: Skin is warm.    CARDIAC STUDIES:  Lexiscan Myoview stress test 11/16/2018: Lexiscan stress test was performed. Stress EKG is non-diagnostic, as this is pharmacological stress test. SPCT images reveal  medium sized, severe intensity, mildly reversible perfusion defect in basal anterior/anteroseptal myocardium; and a small sized, severe intensity, moderately reversible perfusion defect in inferior apical myocardium. Severely dilated LV with stress LVEF 27%.  High risk study.   Echocardiogram 11/24/2018: Normal LV systolic function with EF 59%. Left ventricle cavity is normal in size. Mild to moderate concentric hypertrophy of the left ventricle. Normal global wall motion.  Left atrial cavity is mildly dilated. Thickened interatrial septum. Trileaflet aortic valve with mild aortic valve leaflet calcification. Moderate (Grade III) aortic regurgitation. Mild (Grade I) mitral regurgitation. Mild tricuspid regurgitation. Estimated pulmonary artery systolic pressure is 20 mmHg.           CT scan-10/27/2018- aortic and coronary atherosclerosis Left adrenal adenoma-2.31.7 cm.  COPD.  Assessment & Recommendations:   1.Angina Pectoris  2. Moderate aortic regurgitation  3. Essential Hypertension  4. Hypercholesterolemia  Laboratory Exam:  CBC Latest Ref Rng & Units 12/10/2016  WBC 3.4 - 10.8 x10E3/uL 8.7  Hemoglobin 11.1 - 15.9 g/dL 16.113.2  Hematocrit 09.634.0 - 46.6 % 40.7  Platelets 150 - 379 x10E3/uL 233   CMP Latest Ref Rng & Units 12/10/2016  Glucose 65 - 99 mg/dL 045(W101(H)  BUN 6 - 24 mg/dL 16  Creatinine 0.980.57 - 1.191.00 mg/dL 1.470.91  Sodium 829134 - 562144 mmol/L 142  Potassium 3.5 - 5.2 mmol/L 4.1  Chloride 96 - 106 mmol/L 106  CO2 20 - 29 mmol/L 20  Calcium 8.7 - 10.2 mg/dL 9.8  Total Protein 6.0 - 8.5 g/dL 6.9   Total Bilirubin 0.0 - 1.2 mg/dL 0.3  Alkaline Phos 39 - 117 IU/L 143(H)  AST 0 - 40 IU/L 12  ALT 0 - 32 IU/L 7   10/14/2018- Glucose-99, BUN-7, creatinine-0.93, sodium-142, potassium-4.1.  Normal liver enzymes. TSH-1.2.  WBC-8.1, hemoglobin-12.8, hematocrit-37.5, platelets-215   10/05/2018 -hemoglobin A1c-5.8  Lipid Panel     Component Value Date/Time   CHOL 158 12/10/2016 1446   TRIG 109 12/10/2016 1446   HDL 52 12/10/2016 1446   CHOLHDL 3.0 12/10/2016 1446   LDLCALC 84 12/10/2016 1446   7/8//2020-cholesterol-115, HDL-70, LDL-31, triglycerides-69.  Normal liver enzymes. Recommendation:  Results of Lexiscan Myoview scans were explained to the patient.  She has persisting symptoms of angina and in view of high risk abnormal stress nuclear scans revealing anterior and inferior ischemia, I have recommended cardiac catheterization. Patient has been scheduled for cardiac catheterization, and possible angioplasty, stent implant. We discussed regarding risks, benefits, alternatives to this including continued medical therapy. Patient wants to proceed. Understands <1-2% risk of death, stroke, MI, urgent CABG, bleeding, infection, renal failure but not limited to these.  Patient was advised to continue all the present medications. She was again advised that if there is any prolonged chest pain not relieved with 3 tablets of nitroglycerin or severe chest pain associated with diaphoresis, nausea etc., she should call 911 immediately and go to the ER. She was advised to avoid any heavy exertion until the cardiac catheterization.  Echocardiogram results were explained.  She has moderate aortic regurgitation.  Primary prevention was again discussed with her.  She was advised to follow ADA, low-salt, low-cholesterol diet.  Patient was also advised to calorie restriction to lose weight and was advised very strongly to abstain from smoking permanently.  She will return for follow-up after cardiac  catheterization. Total time spent with patient was 30 minutes and greater than 50% of that time was spent face to face in counseling and coordination care with the patient In  explaining her condition, and different options of management, indications and possiblecomplications of procedure etc.  Earl Many, MD, [facility] 12/01/2018, 10:03 AM [facility] Pager: [phone] Office: [phone] If no answer Cell [phone]

## 2018-12-01 NOTE — Progress Notes (Signed)
Subjective:  Primary Physician:  [name], Mobolaji B, MD  Patient ID: Christine Skinner, female    DOB: 03/19/1956, 63 y.o.   MRN: [id number]  Chief Complaint  Patient presents with  . Hypertension  . Chest Pain  . Results    echo, nuc  . Follow-up    HPI: Christine Skinner  is a 63 y.o. female Patient is complaining of substernal chest pain off and on for past 3 months.  The pain mostly in the evening while she is resting after doing the housework.  The pain radiates to both the arms, no radiation to neck or back.  She describes this as pressure like sensation and she also has some funny feeling in the arms.  There is no associated dyspnea. Once, she had diaphoresis, nausea and vomiting.  Nitroglycerin helps relieve the pain.   Patient had peptic ulcer in the past, she says that these symptoms do not appear like the ulcer symptoms.  She denies any heartburn or acid reflux but has been taking Prilosec therapy.  There is no abdominal pain.  No complaints of shortness of breath, orthopnea or PND.  No history of palpitation, dizziness, near-syncope or syncope.  No history of swelling on the legs and no claudication.  Patient has diabetes mellitus type 2, hypertension and hypercholesterolemia. She has a long history of smoking but patient says that she quit smoking on 11/07/2018. Her father had CABG around age 61.   Patient has history of hypothyroidism and ulcerative colitis.  She also has history of arthritis. No history of TIA or CVA.  Past Medical History:  Diagnosis Date  . Diabetes mellitus without complication (HCC)   . Hyperlipidemia   . Hypertension     Past Surgical History:  Procedure Laterality Date  . FRACTURE SURGERY Right    thumb  . head surgery Left   . TONSILLECTOMY    . TUBAL LIGATION  02/1993    Social History   Socioeconomic History  . Marital status: Divorced    Spouse name: Not on file  . Number of children: 2  . Years of education: Not on file  .  Highest education level: Not on file  Occupational History  . Not on file  Social Needs  . Financial resource strain: Not on file  . Food insecurity    Worry: Not on file    Inability: Not on file  . Transportation needs    Medical: Not on file    Non-medical: Not on file  Tobacco Use  . Smoking status: Former Smoker    Packs/day: 2.00    Years: 41.00    Pack years: 82.00    Quit date: 11/07/2018    Years since quitting: 0.0  . Smokeless tobacco: Never Used  Substance and Sexual Activity  . Alcohol use: No  . Drug use: No  . Sexual activity: Not on file  Lifestyle  . Physical activity    Days per week: Not on file    Minutes per session: Not on file  . Stress: Not on file  Relationships  . Social Musicianconnections    Talks on phone: Not on file    Gets together: Not on file    Attends religious service: Not on file    Active member of club or organization: Not on file    Attends meetings of clubs or organizations: Not on file    Relationship status: Not on file  . Intimate partner violence  Fear of current or ex partner: Not on file    Emotionally abused: Not on file    Physically abused: Not on file    Forced sexual activity: Not on file  Other Topics Concern  . Not on file  Social History Narrative  . Not on file    Current Outpatient Medications on File Prior to Visit  Medication Sig Dispense Refill  . aspirin EC 81 MG tablet Take 81 mg by mouth daily.    Marland Kitchen atorvastatin (LIPITOR) 40 MG tablet Take 1 tablet by mouth daily.    . fluticasone (FLONASE) 50 MCG/ACT nasal spray fluticasone propionate 50 mcg/actuation nasal spray,suspension  Spray 1 spray every day by intranasal route.    Marland Kitchen levothyroxine (SYNTHROID) 100 MCG tablet levothyroxine 100 mcg tablet    . lisinopril (ZESTRIL) 20 MG tablet lisinopril 20 mg tablet  Take 1 tablet every day by oral route in the morning for 90 days.    . metFORMIN (GLUCOPHAGE) 500 MG tablet daily.    . metoprolol succinate  (TOPROL-XL) 25 MG 24 hr tablet Take 1 tablet (25 mg total) by mouth daily. Take with or immediately following a meal. 30 tablet 1  . nitroGLYCERIN (NITROSTAT) 0.4 MG SL tablet     . omeprazole (PRILOSEC) 20 MG capsule     . levothyroxine (SYNTHROID) 150 MCG tablet Take by mouth.     No current facility-administered medications on file prior to visit.     Review of Systems  Constitutional: Negative for fever.  HENT: Negative for nosebleeds.   Eyes: Negative for blurred vision.  Respiratory: Negative for cough and shortness of breath.   Cardiovascular: Positive for chest pain. Negative for palpitations, orthopnea, claudication and leg swelling.  Gastrointestinal: Negative for abdominal pain, nausea and vomiting.  Genitourinary: Negative for dysuria.  Musculoskeletal: Negative for myalgias.  Skin: Negative for itching and rash.  Neurological: Negative for dizziness, seizures and loss of consciousness.  Psychiatric/Behavioral: The patient is not nervous/anxious.       Objective:  Blood pressure (!) 145/81, pulse 72, temperature 97.8 F (36.6 C), height 5\' 6"  (1.676 m), weight 185 lb (83.9 kg), SpO2 99 %. Body mass index is 29.86 kg/m.  Physical Exam  Constitutional: She is oriented to person, place, and time. She appears well-developed and well-nourished.  HENT:  Head: Normocephalic and atraumatic.  Eyes: Conjunctivae are normal.  Neck: No thyromegaly present.  Cardiovascular: Normal rate, regular rhythm and normal heart sounds. Exam reveals no gallop.  No murmur heard. Pulses:      Carotid pulses are 2+ on the right side and 2+ on the left side.      Femoral pulses are 2+ on the right side and 2+ on the left side.      Dorsalis pedis pulses are 2+ on the right side and 2+ on the left side.       Posterior tibial pulses are 2+ on the right side and 2+ on the left side.  Pulmonary/Chest: She has no wheezes. She has no rales.  Abdominal: She exhibits no mass. There is no abdominal  tenderness.  Musculoskeletal:        General: No edema.  Lymphadenopathy:    She has no cervical adenopathy.  Neurological: She is alert and oriented to person, place, and time.  Skin: Skin is warm.    CARDIAC STUDIES:  Lexiscan Myoview stress test 11/16/2018: Lexiscan stress test was performed. Stress EKG is non-diagnostic, as this is pharmacological stress test. SPCT images reveal  medium sized, severe intensity, mildly reversible perfusion defect in basal anterior/anteroseptal myocardium; and a small sized, severe intensity, moderately reversible perfusion defect in inferior apical myocardium. Severely dilated LV with stress LVEF 27%.  High risk study.   Echocardiogram 11/24/2018: Normal LV systolic function with EF 59%. Left ventricle cavity is normal in size. Mild to moderate concentric hypertrophy of the left ventricle. Normal global wall motion.  Left atrial cavity is mildly dilated. Thickened interatrial septum. Trileaflet aortic valve with mild aortic valve leaflet calcification. Moderate (Grade III) aortic regurgitation. Mild (Grade I) mitral regurgitation. Mild tricuspid regurgitation. Estimated pulmonary artery systolic pressure is 20 mmHg.           CT scan-10/27/2018- aortic and coronary atherosclerosis Left adrenal adenoma-2.31.7 cm.  COPD.  Assessment & Recommendations:   1.Angina Pectoris  2. Moderate aortic regurgitation  3. Essential Hypertension  4. Hypercholesterolemia  Laboratory Exam:  CBC Latest Ref Rng & Units 12/10/2016  WBC 3.4 - 10.8 x10E3/uL 8.7  Hemoglobin 11.1 - 15.9 g/dL 13.2  Hematocrit 34.0 - 46.6 % 40.7  Platelets 150 - 379 x10E3/uL 233   CMP Latest Ref Rng & Units 12/10/2016  Glucose 65 - 99 mg/dL 101(H)  BUN 6 - 24 mg/dL 16  Creatinine 0.57 - 1.00 mg/dL 0.91  Sodium 134 - 144 mmol/L 142  Potassium 3.5 - 5.2 mmol/L 4.1  Chloride 96 - 106 mmol/L 106  CO2 20 - 29 mmol/L 20  Calcium 8.7 - 10.2 mg/dL 9.8  Total Protein 6.0 - 8.5 g/dL 6.9   Total Bilirubin 0.0 - 1.2 mg/dL 0.3  Alkaline Phos 39 - 117 IU/L 143(H)  AST 0 - 40 IU/L 12  ALT 0 - 32 IU/L 7   10/14/2018- Glucose-99, BUN-7, creatinine-0.93, sodium-142, potassium-4.1.  Normal liver enzymes. TSH-1.2.  WBC-8.1, hemoglobin-12.8, hematocrit-37.5, platelets-215   10/05/2018 -hemoglobin A1c-5.8  Lipid Panel     Component Value Date/Time   CHOL 158 12/10/2016 1446   TRIG 109 12/10/2016 1446   HDL 52 12/10/2016 1446   CHOLHDL 3.0 12/10/2016 1446   LDLCALC 84 12/10/2016 1446   7/8//2020-cholesterol-115, HDL-70, LDL-31, triglycerides-69.  Normal liver enzymes. Recommendation:  Results of Lexiscan Myoview scans were explained to the patient.  She has persisting symptoms of angina and in view of high risk abnormal stress nuclear scans revealing anterior and inferior ischemia, I have recommended cardiac catheterization. Patient has been scheduled for cardiac catheterization, and possible angioplasty, stent implant. We discussed regarding risks, benefits, alternatives to this including continued medical therapy. Patient wants to proceed. Understands <1-2% risk of death, stroke, MI, urgent CABG, bleeding, infection, renal failure but not limited to these.  Patient was advised to continue all the present medications. She was again advised that if there is any prolonged chest pain not relieved with 3 tablets of nitroglycerin or severe chest pain associated with diaphoresis, nausea etc., she should call 911 immediately and go to the ER. She was advised to avoid any heavy exertion until the cardiac catheterization.  Echocardiogram results were explained.  She has moderate aortic regurgitation.  Primary prevention was again discussed with her.  She was advised to follow ADA, low-salt, low-cholesterol diet.  Patient was also advised to calorie restriction to lose weight and was advised very strongly to abstain from smoking permanently.  She will return for follow-up after cardiac  catheterization. Total time spent with patient was 30 minutes and greater than 50% of that time was spent face to face in counseling and coordination care with the patient In   explaining her condition, and different options of management, indications and possiblecomplications of procedure etc.  Earl Many, MD, [facility] 12/01/2018, 10:03 AM [facility] Pager: [phone] Office: [phone] If no answer Cell [phone]

## 2018-12-04 LAB — CBC WITH DIFFERENTIAL/PLATELET
Basophils Absolute: 0.1 10*3/uL (ref 0.0–0.2)
Basos: 1 %
EOS (ABSOLUTE): 0.2 10*3/uL (ref 0.0–0.4)
Eos: 3 %
Hematocrit: 34.3 % (ref 34.0–46.6)
Hemoglobin: 11.6 g/dL (ref 11.1–15.9)
Immature Grans (Abs): 0 10*3/uL (ref 0.0–0.1)
Immature Granulocytes: 0 %
Lymphocytes Absolute: 3.1 10*3/uL (ref 0.7–3.1)
Lymphs: 43 %
MCH: 31 pg (ref 26.6–33.0)
MCHC: 33.8 g/dL (ref 31.5–35.7)
MCV: 92 fL (ref 79–97)
Monocytes Absolute: 0.4 10*3/uL (ref 0.1–0.9)
Monocytes: 6 %
Neutrophils Absolute: 3.3 10*3/uL (ref 1.4–7.0)
Neutrophils: 47 %
Platelets: 191 10*3/uL (ref 150–450)
RBC: 3.74 x10E6/uL — ABNORMAL LOW (ref 3.77–5.28)
RDW: 12.6 % (ref 11.7–15.4)
WBC: 7.1 10*3/uL (ref 3.4–10.8)

## 2018-12-04 LAB — BASIC METABOLIC PANEL
BUN/Creatinine Ratio: 13 (ref 12–28)
BUN: 12 mg/dL (ref 8–27)
CO2: 23 mmol/L (ref 20–29)
Calcium: 9.1 mg/dL (ref 8.7–10.3)
Chloride: 106 mmol/L (ref 96–106)
Creatinine, Ser: 0.92 mg/dL (ref 0.57–1.00)
GFR calc Af Amer: 78 mL/min/{1.73_m2} (ref >59)
GFR calc non Af Amer: 67 mL/min/{1.73_m2} (ref >59)
Glucose: 91 mg/dL (ref 65–99)
Potassium: 4.8 mmol/L (ref 3.5–5.2)
Sodium: 143 mmol/L (ref 134–144)

## 2018-12-11 ENCOUNTER — Other Ambulatory Visit (HOSPITAL_COMMUNITY)
Admission: RE | Admit: 2018-12-11 | Discharge: 2018-12-11 | Disposition: A | Discharge status: Home / Self Care | Payer: Medicare Other | Source: Ambulatory Visit | Attending: Cardiology | Admitting: Cardiology

## 2018-12-11 DIAGNOSIS — Z01812 Encounter for preprocedural laboratory examination: Secondary | ICD-10-CM | POA: Insufficient documentation

## 2018-12-11 DIAGNOSIS — Z20828 Contact with and (suspected) exposure to other viral communicable diseases: Secondary | ICD-10-CM | POA: Insufficient documentation

## 2018-12-12 LAB — NOVEL CORONAVIRUS, NAA (HOSP ORDER, SEND-OUT TO REF LAB; TAT 18-24 HRS): SARS-CoV-2, NAA: NOT DETECTED

## 2018-12-15 ENCOUNTER — Other Ambulatory Visit (HOSPITAL_COMMUNITY): Payer: Self-pay | Admitting: Respiratory Therapy

## 2018-12-15 ENCOUNTER — Encounter (HOSPITAL_COMMUNITY): Payer: Self-pay | Admitting: General Practice

## 2018-12-15 ENCOUNTER — Encounter (HOSPITAL_COMMUNITY)
Admission: RE | Disposition: A | Discharge status: Home / Self Care | Payer: Self-pay | Source: Home / Self Care | Attending: Thoracic Surgery (Cardiothoracic Vascular Surgery)

## 2018-12-15 ENCOUNTER — Other Ambulatory Visit: Payer: Self-pay | Admitting: *Deleted

## 2018-12-15 ENCOUNTER — Inpatient Hospital Stay (HOSPITAL_COMMUNITY): Payer: Medicare Other

## 2018-12-15 ENCOUNTER — Inpatient Hospital Stay (HOSPITAL_COMMUNITY)
Admission: RE | Admit: 2018-12-15 | Discharge: 2018-12-23 | DRG: 234 | Disposition: A | Discharge status: Home / Self Care | Payer: Medicare Other | Attending: Thoracic Surgery (Cardiothoracic Vascular Surgery) | Admitting: Thoracic Surgery (Cardiothoracic Vascular Surgery)

## 2018-12-15 ENCOUNTER — Other Ambulatory Visit: Payer: Self-pay

## 2018-12-15 DIAGNOSIS — E7800 Pure hypercholesterolemia, unspecified: Secondary | ICD-10-CM | POA: Diagnosis present

## 2018-12-15 DIAGNOSIS — I350 Nonrheumatic aortic (valve) stenosis: Secondary | ICD-10-CM

## 2018-12-15 DIAGNOSIS — Z79899 Other long term (current) drug therapy: Secondary | ICD-10-CM | POA: Diagnosis not present

## 2018-12-15 DIAGNOSIS — R079 Chest pain, unspecified: Secondary | ICD-10-CM | POA: Diagnosis present

## 2018-12-15 DIAGNOSIS — Z7982 Long term (current) use of aspirin: Secondary | ICD-10-CM

## 2018-12-15 DIAGNOSIS — R9439 Abnormal result of other cardiovascular function study: Secondary | ICD-10-CM | POA: Diagnosis not present

## 2018-12-15 DIAGNOSIS — D62 Acute posthemorrhagic anemia: Secondary | ICD-10-CM | POA: Diagnosis not present

## 2018-12-15 DIAGNOSIS — E039 Hypothyroidism, unspecified: Secondary | ICD-10-CM | POA: Diagnosis present

## 2018-12-15 DIAGNOSIS — F17210 Nicotine dependence, cigarettes, uncomplicated: Secondary | ICD-10-CM | POA: Diagnosis present

## 2018-12-15 DIAGNOSIS — I4891 Unspecified atrial fibrillation: Secondary | ICD-10-CM | POA: Diagnosis not present

## 2018-12-15 DIAGNOSIS — F319 Bipolar disorder, unspecified: Secondary | ICD-10-CM | POA: Diagnosis present

## 2018-12-15 DIAGNOSIS — Z720 Tobacco use: Secondary | ICD-10-CM

## 2018-12-15 DIAGNOSIS — Z7984 Long term (current) use of oral hypoglycemic drugs: Secondary | ICD-10-CM | POA: Diagnosis not present

## 2018-12-15 DIAGNOSIS — Z8249 Family history of ischemic heart disease and other diseases of the circulatory system: Secondary | ICD-10-CM | POA: Diagnosis not present

## 2018-12-15 DIAGNOSIS — E8770 Fluid overload, unspecified: Secondary | ICD-10-CM | POA: Diagnosis not present

## 2018-12-15 DIAGNOSIS — I351 Nonrheumatic aortic (valve) insufficiency: Secondary | ICD-10-CM | POA: Diagnosis present

## 2018-12-15 DIAGNOSIS — Z6829 Body mass index (BMI) 29.0-29.9, adult: Secondary | ICD-10-CM | POA: Diagnosis not present

## 2018-12-15 DIAGNOSIS — I10 Essential (primary) hypertension: Secondary | ICD-10-CM | POA: Diagnosis present

## 2018-12-15 DIAGNOSIS — J9811 Atelectasis: Secondary | ICD-10-CM

## 2018-12-15 DIAGNOSIS — K5900 Constipation, unspecified: Secondary | ICD-10-CM | POA: Diagnosis not present

## 2018-12-15 DIAGNOSIS — E669 Obesity, unspecified: Secondary | ICD-10-CM | POA: Diagnosis present

## 2018-12-15 DIAGNOSIS — Z23 Encounter for immunization: Secondary | ICD-10-CM

## 2018-12-15 DIAGNOSIS — E11649 Type 2 diabetes mellitus with hypoglycemia without coma: Secondary | ICD-10-CM | POA: Diagnosis present

## 2018-12-15 DIAGNOSIS — I209 Angina pectoris, unspecified: Secondary | ICD-10-CM | POA: Diagnosis present

## 2018-12-15 DIAGNOSIS — E785 Hyperlipidemia, unspecified: Secondary | ICD-10-CM | POA: Diagnosis present

## 2018-12-15 DIAGNOSIS — I200 Unstable angina: Secondary | ICD-10-CM | POA: Diagnosis present

## 2018-12-15 DIAGNOSIS — I2510 Atherosclerotic heart disease of native coronary artery without angina pectoris: Secondary | ICD-10-CM

## 2018-12-15 DIAGNOSIS — D696 Thrombocytopenia, unspecified: Secondary | ICD-10-CM | POA: Diagnosis not present

## 2018-12-15 DIAGNOSIS — Z01810 Encounter for preprocedural cardiovascular examination: Secondary | ICD-10-CM | POA: Diagnosis not present

## 2018-12-15 DIAGNOSIS — Z20828 Contact with and (suspected) exposure to other viral communicable diseases: Secondary | ICD-10-CM | POA: Diagnosis present

## 2018-12-15 DIAGNOSIS — Z79890 Hormone replacement therapy (postmenopausal): Secondary | ICD-10-CM | POA: Diagnosis not present

## 2018-12-15 DIAGNOSIS — I25110 Atherosclerotic heart disease of native coronary artery with unstable angina pectoris: Secondary | ICD-10-CM | POA: Diagnosis present

## 2018-12-15 DIAGNOSIS — Z951 Presence of aortocoronary bypass graft: Secondary | ICD-10-CM

## 2018-12-15 DIAGNOSIS — Z4682 Encounter for fitting and adjustment of non-vascular catheter: Secondary | ICD-10-CM

## 2018-12-15 HISTORY — PX: LEFT HEART CATH AND CORONARY ANGIOGRAPHY: CATH118249

## 2018-12-15 HISTORY — DX: Ulcerative colitis, unspecified, without complications: K51.90

## 2018-12-15 HISTORY — DX: Atherosclerotic heart disease of native coronary artery without angina pectoris: I25.10

## 2018-12-15 HISTORY — PX: CARDIAC CATHETERIZATION: SHX172

## 2018-12-15 HISTORY — DX: Hypothyroidism, unspecified: E03.9

## 2018-12-15 HISTORY — DX: Nonrheumatic aortic (valve) insufficiency: I35.1

## 2018-12-15 LAB — GLUCOSE, CAPILLARY
Glucose-Capillary: 87 mg/dL (ref 70–99)
Glucose-Capillary: 81 mg/dL (ref 70–99)
Glucose-Capillary: 103 mg/dL — ABNORMAL HIGH (ref 70–99)
Glucose-Capillary: 108 mg/dL — ABNORMAL HIGH (ref 70–99)

## 2018-12-15 LAB — PULMONARY FUNCTION TEST
FEF 25-75 Pre: 1.43 L/sec
FEF2575-%Pred-Pre: 58 %
FEV1-%Pred-Pre: 75 %
FEV1-Pre: 2.06 L
FEV1FVC-%Pred-Pre: 96 %
FEV6-%Pred-Pre: 79 %
FEV6-Pre: 2.7 L
FEV6FVC-%Pred-Pre: 101 %
FVC-%Pred-Pre: 77 %
FVC-Pre: 2.75 L
Pre FEV1/FVC ratio: 75 %
Pre FEV6/FVC Ratio: 98 %

## 2018-12-15 SURGERY — LEFT HEART CATH AND CORONARY ANGIOGRAPHY
Anesthesia: LOCAL

## 2018-12-15 MED ORDER — LIDOCAINE HCL (PF) 1 % IJ SOLN
INTRAMUSCULAR | Status: AC
  Filled 2018-12-15: qty 30

## 2018-12-15 MED ORDER — ASPIRIN EC 81 MG PO TBEC: 81.0000 mg | DELAYED_RELEASE_TABLET | Freq: Every day | ORAL | Status: DC

## 2018-12-15 MED ORDER — SODIUM CHLORIDE 0.9 % IV SOLN: INTRAVENOUS | Status: AC

## 2018-12-15 MED ORDER — FENTANYL CITRATE (PF) 100 MCG/2ML IJ SOLN
INTRAMUSCULAR | Status: AC
  Filled 2018-12-15: qty 2

## 2018-12-15 MED ORDER — HYDRALAZINE HCL 20 MG/ML IJ SOLN
10.0000 mg | INTRAMUSCULAR | Status: AC | PRN
  Administered 2018-12-15: 12:00:00 10 mg via INTRAVENOUS

## 2018-12-15 MED ORDER — LABETALOL HCL 5 MG/ML IV SOLN
INTRAVENOUS | Status: AC
  Filled 2018-12-15: qty 4

## 2018-12-15 MED ORDER — MIDAZOLAM HCL 2 MG/2ML IJ SOLN
INTRAMUSCULAR | Status: DC | PRN
  Administered 2018-12-15 (×2): 1 mg via INTRAVENOUS

## 2018-12-15 MED ORDER — LABETALOL HCL 5 MG/ML IV SOLN
10.0000 mg | INTRAVENOUS | Status: AC | PRN
  Administered 2018-12-15: 11:00:00 10 mg via INTRAVENOUS

## 2018-12-15 MED ORDER — SODIUM CHLORIDE 0.9 % IV SOLN: 250.0000 mL | INTRAVENOUS | Status: DC | PRN

## 2018-12-15 MED ORDER — SODIUM CHLORIDE 0.9 % WEIGHT BASED INFUSION: 1.0000 mL/kg/h | INTRAVENOUS | Status: DC

## 2018-12-15 MED ORDER — SODIUM CHLORIDE 0.9% FLUSH
3.0000 mL | Freq: Two times a day (BID) | INTRAVENOUS | Status: DC
  Administered 2018-12-15 (×2): 3 mL via INTRAVENOUS

## 2018-12-15 MED ORDER — FLUTICASONE PROPIONATE 50 MCG/ACT NA SUSP
1.0000 | Freq: Every day | NASAL | Status: DC | PRN
  Filled 2018-12-15: qty 16

## 2018-12-15 MED ORDER — VERAPAMIL HCL 2.5 MG/ML IV SOLN
INTRAVENOUS | Status: AC
  Filled 2018-12-15: qty 2

## 2018-12-15 MED ORDER — FENTANYL CITRATE (PF) 100 MCG/2ML IJ SOLN
INTRAMUSCULAR | Status: DC | PRN
  Administered 2018-12-15: 25 ug via INTRAVENOUS
  Administered 2018-12-15: 50 ug via INTRAVENOUS

## 2018-12-15 MED ORDER — ONDANSETRON HCL 4 MG/2ML IJ SOLN: 4.0000 mg | Freq: Four times a day (QID) | INTRAMUSCULAR | Status: DC | PRN

## 2018-12-15 MED ORDER — PANTOPRAZOLE SODIUM 40 MG PO TBEC
40.0000 mg | DELAYED_RELEASE_TABLET | Freq: Every day | ORAL | Status: DC
  Administered 2018-12-16: 40 mg via ORAL
  Filled 2018-12-15: qty 1

## 2018-12-15 MED ORDER — ATORVASTATIN CALCIUM 40 MG PO TABS
40.0000 mg | ORAL_TABLET | Freq: Every day | ORAL | Status: DC
  Administered 2018-12-16 – 2018-12-23 (×7): 40 mg via ORAL
  Filled 2018-12-15 (×7): qty 1

## 2018-12-15 MED ORDER — HEPARIN (PORCINE) IN NACL 1000-0.9 UT/500ML-% IV SOLN
INTRAVENOUS | Status: AC
  Filled 2018-12-15: qty 1000

## 2018-12-15 MED ORDER — SODIUM CHLORIDE 0.9 % WEIGHT BASED INFUSION: 3.0000 mL/kg/h | INTRAVENOUS | Status: DC

## 2018-12-15 MED ORDER — ASPIRIN EC 81 MG PO TBEC
81.0000 mg | DELAYED_RELEASE_TABLET | Freq: Every day | ORAL | Status: DC
  Administered 2018-12-16: 81 mg via ORAL
  Filled 2018-12-15: qty 1

## 2018-12-15 MED ORDER — PNEUMOCOCCAL VAC POLYVALENT 25 MCG/0.5ML IJ INJ
0.5000 mL | INJECTION | INTRAMUSCULAR | Status: AC
  Administered 2018-12-16: 0.5 mL via INTRAMUSCULAR

## 2018-12-15 MED ORDER — HEPARIN SODIUM (PORCINE) 5000 UNIT/ML IJ SOLN
5000.0000 [IU] | Freq: Three times a day (TID) | INTRAMUSCULAR | Status: AC
  Administered 2018-12-15 – 2018-12-16 (×4): 5000 [IU] via SUBCUTANEOUS
  Filled 2018-12-15 (×4): qty 1

## 2018-12-15 MED ORDER — ACETAMINOPHEN 325 MG PO TABS
650.0000 mg | ORAL_TABLET | ORAL | Status: DC | PRN
  Administered 2018-12-16: 650 mg via ORAL
  Filled 2018-12-15: qty 2

## 2018-12-15 MED ORDER — SODIUM CHLORIDE 0.9% FLUSH: 3.0000 mL | INTRAVENOUS | Status: DC | PRN

## 2018-12-15 MED ORDER — LEVOTHYROXINE SODIUM 100 MCG PO TABS
100.0000 ug | ORAL_TABLET | Freq: Every day | ORAL | Status: DC
  Administered 2018-12-16 – 2018-12-23 (×7): 100 ug via ORAL
  Filled 2018-12-15 (×7): qty 1

## 2018-12-15 MED ORDER — MIDAZOLAM HCL 2 MG/2ML IJ SOLN
INTRAMUSCULAR | Status: AC
  Filled 2018-12-15: qty 2

## 2018-12-15 MED ORDER — IOHEXOL 350 MG/ML SOLN
INTRAVENOUS | Status: DC | PRN
  Administered 2018-12-15: 40 mL

## 2018-12-15 MED ORDER — LISINOPRIL 20 MG PO TABS: 20.0000 mg | ORAL_TABLET | Freq: Every day | ORAL | Status: DC

## 2018-12-15 MED ORDER — NITROGLYCERIN 0.4 MG SL SUBL: 0.4000 mg | SUBLINGUAL_TABLET | SUBLINGUAL | Status: DC | PRN

## 2018-12-15 MED ORDER — ASPIRIN 81 MG PO CHEW: 81.0000 mg | CHEWABLE_TABLET | ORAL | Status: DC

## 2018-12-15 MED ORDER — LIDOCAINE HCL (PF) 1 % IJ SOLN
INTRAMUSCULAR | Status: DC | PRN
  Administered 2018-12-15: 2 mL

## 2018-12-15 MED ORDER — HEPARIN (PORCINE) IN NACL 1000-0.9 UT/500ML-% IV SOLN
INTRAVENOUS | Status: DC | PRN
  Administered 2018-12-15: 500 mL

## 2018-12-15 MED ORDER — HEPARIN SODIUM (PORCINE) 1000 UNIT/ML IJ SOLN
INTRAMUSCULAR | Status: DC | PRN
  Administered 2018-12-15: 4000 [IU] via INTRAVENOUS

## 2018-12-15 MED ORDER — METOPROLOL SUCCINATE ER 25 MG PO TB24
25.0000 mg | ORAL_TABLET | Freq: Every day | ORAL | Status: DC
  Administered 2018-12-16: 25 mg via ORAL
  Filled 2018-12-15: qty 1

## 2018-12-15 MED ORDER — HEPARIN SODIUM (PORCINE) 1000 UNIT/ML IJ SOLN
INTRAMUSCULAR | Status: AC
  Filled 2018-12-15: qty 1

## 2018-12-15 MED ORDER — LISINOPRIL 20 MG PO TABS
20.0000 mg | ORAL_TABLET | Freq: Every day | ORAL | Status: DC
  Administered 2018-12-16: 09:00:00 20 mg via ORAL
  Filled 2018-12-15: qty 1

## 2018-12-15 MED ORDER — SODIUM CHLORIDE 0.9% FLUSH
3.0000 mL | Freq: Two times a day (BID) | INTRAVENOUS | Status: DC
  Administered 2018-12-15 – 2018-12-16 (×3): 3 mL via INTRAVENOUS

## 2018-12-15 MED ORDER — VERAPAMIL HCL 2.5 MG/ML IV SOLN
INTRA_ARTERIAL | Status: DC | PRN
  Administered 2018-12-15: 10 mL via INTRA_ARTERIAL

## 2018-12-15 MED ORDER — HYDRALAZINE HCL 20 MG/ML IJ SOLN
INTRAMUSCULAR | Status: AC
  Filled 2018-12-15: qty 1

## 2018-12-15 MED ORDER — INSULIN ASPART 100 UNIT/ML ~~LOC~~ SOLN: 0.0000 [IU] | Freq: Three times a day (TID) | SUBCUTANEOUS | Status: DC

## 2018-12-15 SURGICAL SUPPLY — 11 items
CATH INFINITI JR4 5F (CATHETERS) ×1 IMPLANT
CATH OPTITORQUE TIG 4.0 5F (CATHETERS) ×1 IMPLANT
DEVICE RAD COMP TR BAND LRG (VASCULAR PRODUCTS) ×1 IMPLANT
GLIDESHEATH SLEND A-KIT 6F 22G (SHEATH) ×1 IMPLANT
GUIDEWIRE INQWIRE 1.5J.035X260 (WIRE) IMPLANT
INQWIRE 1.5J .035X260CM (WIRE) ×2
KIT HEART LEFT (KITS) ×2 IMPLANT
PACK CARDIAC CATHETERIZATION (CUSTOM PROCEDURE TRAY) ×2 IMPLANT
SHEATH PROBE COVER 6X72 (BAG) ×1 IMPLANT
TRANSDUCER W/STOPCOCK (MISCELLANEOUS) ×2 IMPLANT
TUBING CIL FLEX 10 FLL-RA (TUBING) ×2 IMPLANT

## 2018-12-15 NOTE — Progress Notes (Addendum)
ANTICOAGULATION CONSULT NOTE - Initial Consult  Pharmacy Consult for Heparin Indication: VTE prophylaxis  No Known Allergies  Patient Measurements: Height: 5\' 6"  (167.6 cm) Weight: 181 lb (82.1 kg) IBW/kg (Calculated) : 59.3 Heparin Dosing Weight: 76.5 kg  Vital Signs: Temp: 98 F (36.7 C) (09/08 1403) Temp Source: Oral (09/08 0931) BP: 138/64 (09/08 1516) Pulse Rate: 61 (09/08 1516)  Labs: Most recent labs: 12/03/18: Hgb 11.6 Hct 34.3 Platelets 191  Estimated Creatinine Clearance: 69.3 mL/min (by C-G formula based on SCr of 0.92 mg/dL).   Medical History: Past Medical History:  Diagnosis Date  . Coronary artery disease   . Diabetes mellitus without complication ([place])   . Hyperlipidemia   . Hypertension   . Hypothyroidism   . 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 Per RN, TR band was removed ~1 PM today; no signs/symptoms of bleeding observed.  Goal of Therapy:  Prevention of VTE Monitor platelets by anticoagulation protocol: Yes   Plan:  Heparin 5000 units SQ Q 8 hrs, starting this evening Monitor CBC daily Monitor for signs/symptoms of bleeding  Gillermina Hu, PharmD, BCPS, [facility] Clinical Pharmacist 12/15/2018,4:35 PM

## 2018-12-15 NOTE — Consult Note (Addendum)
[address]       [place] [address]             [phone]        KANAYA DIBONA [facility] Medical Record #[id number] Date of Birth: 1955-05-01  Referring: Patwhardan Primary Care: Har[place], MD Primary Cardiologist:Vyas, Karren Burly, MD  Chief Complaint: CAD  History of Present Illness:      Ms. Lesnik is a 63 yo mildly obese female with history of hypertension, DM, and Hypothyroidism.  The patient had been in her normal state of health until about 3 months ago.  At that time she developed episodes of substernal chest pain that occurs on and off.  She notes these episodes are mostly in the evening while she is resting after performing house work.  The pain would radiate down both of her arms.  She describes the pain as a pressure and her arms feel "funny."  There is no associated shortness of breath, but she did have a single episode of diaphoresis, N/V.  She has taken SL NTG which relived the pain.  She underwent a Myoview study which was felt to be abnormal/high risk with evidence of anterior and inferior ischemia.  Echocardiogram was also performed and showed moderate aortic regurgitation.  It was felt she should undergo further workup with cardiac catheterization.  This was performed today by Dr. Geralynn Ochs and showed multivessel CAD with LM involvement.  It was felt coronary bypass grafting would be indicated, she was admitted and Cardiothoracic surgery consultation has been requested.  Currently the patient is chest pain free.  She again described the above mentioned complaints.  She is a current smoker, she is down to a few cigarettes per day since last month.  However, she has smoked more than 2 packs of cigarettes per day for the past 20 years.  She is motivated to quit and is currently using a nicotine patch.  Prior to her angina symptoms she was fully active and not limited in her activities.  She worked out at [employer] twice a week and walked daily  otherwise.  She states her father had bypass surgery in his 34s.  She is agreeable to proceed with surgery.  Current Activity/ Functional Status: Patient is independent with mobility/ambulation, transfers, ADL's, IADL's.   Zubrod Score: At the time of surgery this patients most appropriate activity status/level should be described as: []     0    Normal activity, no symptoms [x]     1    Restricted in physical strenuous activity but ambulatory, able to do out light work []     2    Ambulatory and capable of self care, unable to do work activities, up and about                 more than 50%  Of the time                            []     3    Only limited self care, in bed greater than 50% of waking hours []     4    Completely disabled, no self care, confined to bed or chair []     5    Moribund  Past Medical History:  Diagnosis Date   Diabetes mellitus without complication (HCC)    Hyperlipidemia    Hypertension     Past Surgical History:  Procedure  Laterality Date   FRACTURE SURGERY Right    thumb   head surgery Left    TONSILLECTOMY     TUBAL LIGATION  02/1993    Social History   Tobacco Use  Smoking Status Former Smoker   Packs/day: 2.00   Years: 41.00   Pack years: 82.00   Quit date: 11/07/2018   Years since quitting: 0.1  Smokeless Tobacco Never Used    Social History   Substance and Sexual Activity  Alcohol Use No     No Known Allergies  Current Facility-Administered Medications  Medication Dose Route Frequency Provider Last Rate Last Dose   0.9 %  sodium chloride infusion  250 mL Intravenous PRN Yates Decamp, MD       0.9 %  sodium chloride infusion   Intravenous Continuous Patwardhan, Manish J, MD 75 mL/hr at 12/15/18 1115     [START ON 12/16/2018] 0.9% sodium chloride infusion  3 mL/kg/hr Intravenous Continuous Yates Decamp, MD       Followed by   Melene Muller ON 12/16/2018] 0.9% sodium chloride infusion  1 mL/kg/hr Intravenous Continuous Yates Decamp, MD        [START ON 12/16/2018] aspirin chewable tablet 81 mg  81 mg Oral Tarri Abernethy, MD       [START ON 12/16/2018] aspirin EC tablet 81 mg  81 mg Oral Daily Patwardhan, Manish J, MD       atorvastatin (LIPITOR) tablet 40 mg  40 mg Oral Daily Patwardhan, Manish J, MD       fluticasone (FLONASE) 50 MCG/ACT nasal spray 1 spray  1 spray Each Nare Daily PRN Patwardhan, Manish J, MD       hydrALAZINE (APRESOLINE) injection 10 mg  10 mg Intravenous Q20 Min PRN Patwardhan, Manish J, MD   10 mg at 12/15/18 1200   labetalol (NORMODYNE) injection 10 mg  10 mg Intravenous Q10 min PRN Patwardhan, Manish J, MD   10 mg at 12/15/18 1126   [START ON 12/16/2018] levothyroxine (SYNTHROID) tablet 100 mcg  100 mcg Oral QAC breakfast Patwardhan, Anabel Bene, MD       [START ON 12/16/2018] lisinopril (ZESTRIL) tablet 20 mg  20 mg Oral Daily Patwardhan, Manish J, MD       metoprolol succinate (TOPROL-XL) 24 hr tablet 25 mg  25 mg Oral Daily Patwardhan, Manish J, MD       nitroGLYCERIN (NITROSTAT) SL tablet 0.4 mg  0.4 mg Sublingual Q5 min PRN Patwardhan, Manish J, MD       pantoprazole (PROTONIX) EC tablet 40 mg  40 mg Oral Daily Patwardhan, Manish J, MD       sodium chloride flush (NS) 0.9 % injection 3 mL  3 mL Intravenous Q12H Yates Decamp, MD       sodium chloride flush (NS) 0.9 % injection 3 mL  3 mL Intravenous PRN Yates Decamp, MD        Medications Prior to Admission  Medication Sig Dispense Refill Last Dose   aspirin EC 81 MG tablet Take 81 mg by mouth daily.   12/15/2018 at 0700   atorvastatin (LIPITOR) 40 MG tablet Take 40 mg by mouth daily.    12/15/2018 at 0700   fluticasone (FLONASE) 50 MCG/ACT nasal spray Place 1 spray into both nostrils daily as needed for allergies.    12/15/2018 at Unknown time   levothyroxine (SYNTHROID) 100 MCG tablet Take 100 mcg by mouth daily before breakfast.    12/15/2018 at 0700   lisinopril (ZESTRIL) 20  MG tablet Take 20 mg by mouth daily.    12/15/2018 at 0700   metFORMIN  (GLUCOPHAGE) 500 MG tablet Take 500 mg by mouth daily with breakfast.    12/13/2018   metoprolol succinate (TOPROL-XL) 25 MG 24 hr tablet Take 1 tablet (25 mg total) by mouth daily. Take with or immediately following a meal. 30 tablet 1    nitroGLYCERIN (NITROSTAT) 0.4 MG SL tablet Place 0.4 mg under the tongue every 5 (five) minutes as needed for chest pain.    12/14/2018 at 0900   omeprazole (PRILOSEC) 20 MG capsule Take 20 mg by mouth daily.    12/15/2018 at 0700    Family History  Problem Relation Age of Onset   Cancer Mother        colon and bone   Hypertension Father    Heart failure Father    Thyroid disease Father    Thyroid disease Brother    Review of Systems:   ROS    Cardiac Review of Systems: Y or  [    ]= no  Chest Pain [ Y   ]  Resting SOB [   ] Exertional SOB  [N ]  Orthopnea [  ]   Pedal Edema Aqua.Slicker   ]    Palpitations Aqua.Slicker  ] Syncope  [  ]   Presyncope [   ]  General Review of Systems: [Y] = yes [  ]=no Constitional: recent weight change Aqua.Slicker ]; anorexia [  ]; fatigue [  ]; nausea Aqua.Slicker  ]; night sweats [  ]; fever Aqua.Slicker  ]; or chills [  ]                                                               Dental: Last Dentist visit:  Patient has full dentures  Eye : blurred vision [  ]; diplopia [   ]; vision changes [  ];  Amaurosis fugax[  ]; Resp: cough [ Y ];  wheezing[  ];  hemoptysis[  ]; shortness of breath[ N ]; paroxysmal nocturnal dyspnea[  ]; dyspnea on exertion[  ]; or orthopnea[  ];  GI:  gallstones[  ], vomiting[Y  ];  dysphagia[  ]; melena[  ];  hematochezia [  ]; heartburn[  ];   Hx of  Colonoscopy[  ]; GU: kidney stones [  ]; hematuria[  ];   dysuria [  ];  nocturia[  ];  history of     obstruction [  ]; urinary frequency [  ]             Skin: rash, swelling[ N ];, hair loss[  ];  peripheral edema[  ];  or itching[  ]; Musculosketetal: myalgias[  ];  joint swelling[  ];  joint erythema[  ];  joint pain[  ];  back pain[  ];  Heme/Lymph: bruising[  ];  bleeding[  ];   anemia[  ];  Neuro: TIA[  ];  headaches[  ];  stroke[N  ];  vertigo[  ];  seizures[  ];   paresthesias[  ];  difficulty walking[ N ];  Psych:depression[  ]; anxiety[  ];  Endocrine: diabetes[ Y ];  thyroid dysfunction[  ];  Physical Exam: BP (!) 183/75    Pulse Marland Kitchen)  58    Temp 98.9 F (37.2 C) (Oral)    Resp 20    Ht 5\' 6"  (1.676 m)    Wt 82.1 kg    SpO2 100%    BMI 29.21 kg/m   General appearance: alert, cooperative and no distress Head: Normocephalic, without obvious abnormality, atraumatic Neck: no adenopathy, no carotid bruit, no JVD, supple, symmetrical, trachea midline and thyroid not enlarged, symmetric, no tenderness/mass/nodules Resp: clear to auscultation bilaterally Cardio: regular rate and rhythm GI: soft, non-tender; bowel sounds normal; no masses,  no organomegaly Extremities: extremities normal, atraumatic, no cyanosis or edema Neurologic: Grossly normal  Diagnostic Studies & Laboratory data:     Recent Radiology Findings:   No results found.   I have independently reviewed the above radiologic studies and discussed with the patient   Recent Lab Findings: Lab Results  Component Value Date   WBC 7.1 12/03/2018   HGB 11.6 12/03/2018   HCT 34.3 12/03/2018   PLT 191 12/03/2018   GLUCOSE 91 12/03/2018   CHOL 158 12/10/2016   TRIG 109 12/10/2016   HDL 52 12/10/2016   LDLCALC 84 12/10/2016   ALT 7 12/10/2016   AST 12 12/10/2016   NA 143 12/03/2018   K 4.8 12/03/2018   CL 106 12/03/2018   CREATININE 0.92 12/03/2018   BUN 12 12/03/2018   CO2 23 12/03/2018   TSH 0.024 (L) 12/10/2016    Assessment / Plan:      1. CAD- will need CABG 2. Aortic Regurgitation- moderate on TTE, can assess with TEE intraoperatively and replace at time of surgery if indicated 3. Heavy Nicotine abuse- patient smoked more than 2 packs per day for 20 years.. She has cut back to a several cigarettes per day since last month and is utilizing a nicotine patch... she will get PFTs as part  of workup 4. DM 5. Dispo- patient currently without chest pain.  She has multivessel CAD and will require CABG +/- Aortic Valve Depending on intraoperative TEE findings  I  spent 55 minutes counseling the patient face to face.   Lowella Dandyrin Barrett, PA-C  12/15/2018 12:24 PM     I have seen and examined the patient and agree with the assessment and plan as outlined above by Lowella DandyErin Barrett, PA-C.  Patient is a 63 year old female with former history of bipolar disorder and morbid obesity, hypertension, type 2 diabetes mellitus, hypothyroidism and longstanding tobacco abuse who more recently was found that her underlying psychiatric illness had been misdiagnosed and subsequently lost a tremendous amount of weight.  She reportedly has been eating a much more healthy diet and she has become much more functional.  Her diabetes has become much more easy to control.  However, over the past several months the patient has developed accelerating symptoms of substernal chest pain consistent with unstable angina pectoris.  Symptoms are relieved by sublingual nitroglycerin.  She recently underwent stress Myoview study which was felt to be abnormal and high risk.  Transthoracic echocardiogram revealed normal left ventricular systolic function with moderate aortic insufficiency.  I have personally reviewed the previous outpatient transthoracic echocardiogram and the diagnostic cardiac catheterization performed earlier today by Dr. Rosemary HolmsPatwardhan.  Catheterization reveals severe left main and three-vessel coronary artery disease with preserved left ventricular systolic function.  Echocardiogram reveals normal left ventricular size and systolic function.  The aortic valve is trileaflet.  There is a central jet of aortic insufficiency that is probably moderate in severity.  No other significant abnormalities are identified.  I agree the patient needs surgical coronary revascularization.  She may benefit from concomitant aortic  valve repair or replacement.  I agree with hospital admission for urgent surgical intervention.  I have reviewed the indications, risks, and potential benefits of coronary artery bypass grafting with the patient at her bedside.  Alternative treatment strategies have been discussed, including the relative risks, benefits and long term prognosis associated with medical therapy, percutaneous coronary intervention, and surgical revascularization.  The possible need for concomitant aortic valve repair or replacement has been discussed.  The patient understands and accepts all potential associated risks of surgery including but not limited to risk of death, stroke or other neurologic complication, myocardial infarction, congestive heart failure, respiratory failure, renal failure, bleeding requiring blood transfusion and/or reexploration, aortic dissection or other major vascular complication, arrhythmia, heart block or bradycardia requiring permanent pacemaker, pneumonia, pleural effusion, wound infection, pulmonary embolus or other thromboembolic complication, chronic pain or other delayed complications related to median sternotomy, the late recurrence of symptomatic ischemic heart disease and/or congestive heart failure, or other late complications related to valve replacement including structural valve deterioration and failure, thrombosis, endocarditis, or paravalvular leak.  The importance of long term risk modification have been emphasized including smoking cessation.  Discussion was held comparing the relative risks of mechanical valve replacement with need for lifelong anticoagulation versus use of a bioprosthetic tissue valve and the associated potential for late structural valve deterioration and failure.  This discussion was placed in the context of the patient's particular circumstances, and as a result the patient specifically requests that their valve be replaced using a bioprosthetic tissue valve.   We  plan to proceed with coronary artery bypass grafting with intraoperative transesophageal echocardiogram and possible aortic valve repair or replacement on Thursday, December 17, 2018.   All questions answered.   I spent in excess of 60 minutes during the conduct of this hospital encounter and >50% of this time involved direct face-to-face encounter with the patient for counseling and/or coordination of their care.   Purcell Nailslarence H Jessie Schrieber, MD 12/15/2018 1:50 PM

## 2018-12-15 NOTE — H&P (Addendum)
Christine Skinner is an 63 y.o. female.   Chief Complaint: Chest pain HPI:   63 y/o Caucasian female with hypertension, type 2 DM, tobacco abuse, family h/o CAD, mod AI, with exertional and occasional at rest angina episodes. Given high risk stress test, she was recommended to undergo coronary angiography.  Severe multivessel CAD noted, including severe distal LM bifurcation stenosis. Patient being admitted for CABG.  Past Medical History:  Diagnosis Date  . Diabetes mellitus without complication (HCC)   . Hyperlipidemia   . Hypertension     Past Surgical History:  Procedure Laterality Date  . FRACTURE SURGERY Right    thumb  . head surgery Left   . TONSILLECTOMY    . TUBAL LIGATION  02/1993    Family History  Problem Relation Age of Onset  . Cancer Mother        colon and bone  . Hypertension Father   . Heart failure Father   . Thyroid disease Father   . Thyroid disease Brother    Social History:  reports that she quit smoking about 5 weeks ago. She has a 82.00 pack-year smoking history. She has never used smokeless tobacco. She reports that she does not drink alcohol or use drugs.  Allergies: No Known Allergies  Review of Systems  Constitution: Negative for decreased appetite, malaise/fatigue, weight gain and weight loss.  HENT: Negative for congestion.   Eyes: Negative for visual disturbance.  Cardiovascular: Positive for chest pain and dyspnea on exertion. Negative for leg swelling, palpitations and syncope.  Respiratory: Positive for shortness of breath. Negative for cough.   Endocrine: Negative for cold intolerance.  Hematologic/Lymphatic: Does not bruise/bleed easily.  Skin: Negative for itching and rash.  Musculoskeletal: Negative for myalgias.  Gastrointestinal: Negative for abdominal pain, nausea and vomiting.  Genitourinary: Negative for dysuria.  Neurological: Negative for dizziness and weakness.  Psychiatric/Behavioral: The patient is not nervous/anxious.    All other systems reviewed and are negative.    Blood pressure (!) 180/74, pulse (!) 58, temperature 98.9 F (37.2 C), temperature source Oral, resp. rate 20, height 5\' 6"  (1.676 m), weight 82.1 kg, SpO2 100 %. Body mass index is 29.21 kg/m.  Physical Exam  Constitutional: She is oriented to person, place, and time. She appears well-developed and well-nourished. No distress.  HENT:  Head: Normocephalic and atraumatic.  Eyes: Pupils are equal, round, and reactive to light. Conjunctivae are normal.  Neck: No JVD present.  Cardiovascular: Normal rate, regular rhythm and intact distal pulses.  Murmur heard.  Holodiastolic murmur is present with a grade of 3/6. Pulmonary/Chest: Effort normal and breath sounds normal. She has no wheezes. She has no rales.  Abdominal: Soft. Bowel sounds are normal. There is no rebound.  Musculoskeletal:        General: No edema.  Lymphadenopathy:    She has no cervical adenopathy.  Neurological: She is alert and oriented to person, place, and time. No cranial nerve deficit.  Skin: Skin is warm and dry.  Psychiatric: She has a normal mood and affect.  Nursing note and vitals reviewed.   Results for orders placed or performed during the hospital encounter of 12/15/18 (from the past 48 hour(s))  Glucose, capillary     Status: None   Collection Time: 12/15/18  9:33 AM  Result Value Ref Range   Glucose-Capillary 87 70 - 99 mg/dL   Comment 1 Notify RN    Comment 2 Document in Chart   Glucose, capillary  Status: None   Collection Time: 12/15/18 11:18 AM  Result Value Ref Range   Glucose-Capillary 81 70 - 99 mg/dL    Labs:   Lab Results  Component Value Date   WBC 7.1 12/03/2018   HGB 11.6 12/03/2018   HCT 34.3 12/03/2018   MCV 92 12/03/2018   PLT 191 12/03/2018   No results for input(s): NA, K, CL, CO2, BUN, CREATININE, CALCIUM, PROT, BILITOT, ALKPHOS, ALT, AST, GLUCOSE in the last 168 hours.  Invalid input(s): LABALBU  Lipid Panel      Component Value Date/Time   CHOL 158 12/10/2016 1446   TRIG 109 12/10/2016 1446   HDL 52 12/10/2016 1446   CHOLHDL 3.0 12/10/2016 1446   LDLCALC 84 12/10/2016 1446      Medications Prior to Admission  Medication Sig Dispense Refill  . aspirin EC 81 MG tablet Take 81 mg by mouth daily.    Marland Kitchen atorvastatin (LIPITOR) 40 MG tablet Take 40 mg by mouth daily.     . fluticasone (FLONASE) 50 MCG/ACT nasal spray Place 1 spray into both nostrils daily as needed for allergies.     Marland Kitchen levothyroxine (SYNTHROID) 100 MCG tablet Take 100 mcg by mouth daily before breakfast.     . lisinopril (ZESTRIL) 20 MG tablet Take 20 mg by mouth daily.     . metFORMIN (GLUCOPHAGE) 500 MG tablet Take 500 mg by mouth daily with breakfast.     . metoprolol succinate (TOPROL-XL) 25 MG 24 hr tablet Take 1 tablet (25 mg total) by mouth daily. Take with or immediately following a meal. 30 tablet 1  . nitroGLYCERIN (NITROSTAT) 0.4 MG SL tablet Place 0.4 mg under the tongue every 5 (five) minutes as needed for chest pain.     Marland Kitchen omeprazole (PRILOSEC) 20 MG capsule Take 20 mg by mouth daily.         Current Facility-Administered Medications:  .  0.9 %  sodium chloride infusion, 250 mL, Intravenous, PRN, Adrian Prows, MD .  0.9 %  sodium chloride infusion, , Intravenous, Continuous, Caprina Wussow J, MD, Last Rate: 75 mL/hr at 12/15/18 1115 .  [START ON 12/16/2018] 0.9% sodium chloride infusion, 3 mL/kg/hr, Intravenous, Continuous **FOLLOWED BY** [START ON 12/16/2018] 0.9% sodium chloride infusion, 1 mL/kg/hr, Intravenous, Continuous, Adrian Prows, MD .  Derrill Memo ON 12/16/2018] aspirin chewable tablet 81 mg, 81 mg, Oral, Pre-Cath, Adrian Prows, MD .  Derrill Memo ON 12/16/2018] aspirin EC tablet 81 mg, 81 mg, Oral, Daily, Atlee Villers J, MD .  atorvastatin (LIPITOR) tablet 40 mg, 40 mg, Oral, Daily, Shain Pauwels J, MD .  fluticasone (FLONASE) 50 MCG/ACT nasal spray 1 spray, 1 spray, Each Nare, Daily PRN, Katherine Tout J, MD .   hydrALAZINE (APRESOLINE) injection 10 mg, 10 mg, Intravenous, Q20 Min PRN, Brennley Curtice J, MD .  labetalol (NORMODYNE) injection 10 mg, 10 mg, Intravenous, Q10 min PRN, Carmela Piechowski J, MD, 10 mg at 12/15/18 1126 .  [START ON 12/16/2018] levothyroxine (SYNTHROID) tablet 100 mcg, 100 mcg, Oral, QAC breakfast, Dreshawn Hendershott J, MD .  Derrill Memo ON 12/16/2018] lisinopril (ZESTRIL) tablet 20 mg, 20 mg, Oral, Daily, Ericah Scotto J, MD .  metoprolol succinate (TOPROL-XL) 24 hr tablet 25 mg, 25 mg, Oral, Daily, Alydia Gosser J, MD .  nitroGLYCERIN (NITROSTAT) SL tablet 0.4 mg, 0.4 mg, Sublingual, Q5 min PRN, Cletus Mehlhoff J, MD .  pantoprazole (PROTONIX) EC tablet 40 mg, 40 mg, Oral, Daily, Daxten Kovalenko J, MD .  sodium chloride flush (NS) 0.9 %  injection 3 mL, 3 mL, Intravenous, Q12H, Yates DecampGanji, Jay, MD .  sodium chloride flush (NS) 0.9 % injection 3 mL, 3 mL, Intravenous, PRN, Yates DecampGanji, Jay, MD   Today's Vitals   12/15/18 1112 12/15/18 1115 12/15/18 1120 12/15/18 1125  BP:  (!) 188/79 (!) 177/67 (!) 180/74  Pulse:  61 (!) 56 (!) 58  Resp:  16 13 20   Temp:      TempSrc:      SpO2:  100% 100% 100%  Weight:      Height:      PainSc: 0-No pain      Body mass index is 29.21 kg/m.   CARDIAC STUDIES:  Coronary angiography 12/15/2018: LM: Distal LM birfucation 95% severely calcified stenosis. LAD: Ostial/prox LAD 95% severely calcified stenosis.         Distal apical LAD diffuse 90% stenosis         Occluded Diag 1 LCx:  Ostial LCx 95% severely calcified stenosis.         OM2 prox 40% stenosis RCA: Prox 40% calcific stenosis.         Mid 80% moderately calcified stenosis.         Prox 40% PDA stenosis.  LVEDP mildly elevated 19 mmHg.    CARDIAC STUDIES:  Lexiscan Myoview stress test 11/16/2018: Lexiscan stress test was performed. Stress EKG is non-diagnostic, as this is pharmacological stress test. SPCT images reveal medium sized, severe intensity, mildly  reversible perfusion defect in basal anterior/anteroseptal myocardium; and a small sized, severe intensity, moderately reversible perfusion defect in inferior apical myocardium. Severely dilated LV with stress LVEF 27%.  High risk study.   Echocardiogram 11/24/2018: Normal LV systolic function with EF 59%. Left ventricle cavity is normal in size. Mild to moderate concentric hypertrophy of the left ventricle. Normal global wall motion.  Left atrial cavity is mildly dilated. Thickened interatrial septum. Trileaflet aortic valve with mild aortic valve leaflet calcification. Moderate (Grade III) aortic regurgitation. Mild (Grade I) mitral regurgitation. Mild tricuspid regurgitation. Estimated pulmonary artery systolic pressure is 20 mmHg.           CT scan-10/27/2018- aortic and coronary atherosclerosis Left adrenal adenoma-2.31.7 cm.  COPD.   Assessment/Plan  63 y/o Caucasian female with hypertension, type 2 DM, tobacco abuse, family h/o CAD, mod AI, severe multivessel CAD  CAD:  Severe multivessel CAD, including distal left main bifurcation stenosis. Exertional, as well as occasional resting anginal symptoms. Admitted to telemetry with plan for CABG this admission. Continue aspirin, metoprolol, sublingual nitroglycerin as needed, statin. Possibly, CABG + AVR given mod AI.  Hypertension: Continue baseline medication lisinopril and metoprolol. PRN hydralazine/labetalol.  Type 2 diabetes mellitus: Sliding scale insulin.  Hold metformin.  Elder NegusManish J Nashalie Sallis, MD 12/15/2018, 11:55 AM [facility] [place] Pager: [phone] Office: [phone] If no answer: [phone]

## 2018-12-15 NOTE — Progress Notes (Signed)
All air has been removed from the Tr band, site appears normal. Will let the Tr band remain on for 30 minutes then access afterwards.

## 2018-12-15 NOTE — Progress Notes (Signed)
2cc's removed from Tr band. 8cc's remain

## 2018-12-15 NOTE — Interval H&P Note (Signed)
History and Physical Interval Note:  12/15/2018 10:18 AM  Christine Skinner  has presented today for surgery, with the diagnosis of angina - abnormal stress test.  The various methods of treatment have been discussed with the patient and family. After consideration of risks, benefits and other options for treatment, the patient has consented to  Procedure(s): LEFT HEART CATH AND CORONARY ANGIOGRAPHY (N/A) as a surgical intervention.  The patient's history has been reviewed, patient examined, no change in status, stable for surgery.  I have reviewed the patient's chart and labs.  Questions were answered to the patient's satisfaction.    2016/2017 Appropriate Use Criteria for Coronary Revascularization Clinical Presentation: Diabetes Mellitus? Symptom Status? S/P CABG? Antianginal Therapy (# of long-acting drugs)? Results of Non-invasive testing? FFR/iFR results in all diseased vessels? Patient undergoing renal transplant? Patient undergoing percutaneous valve procedure (TAVR, MitraClip, Others)? Symptom Status:  Ischemic Symptoms  Non-invasive Testing:  High risk  If no or indeterminate stress test, FFR/iFR results in all diseased vessels:  N/A  Diabetes Mellitus:  No  S/P CABG:  No  Antianginal therapy (number of long-acting drugs):  1  Patient undergoing renal transplant:  No  Patient undergoing percutaneous valve procedure:  No    newline 1 Vessel Disease PCI CABG  No proximal LAD involvement, No proximal left dominant LCX involvement M (6); Indication 2 M (4); Indication 2   Proximal left dominant LCX involvement A (7); Indication 5 A (7); Indication 5   Proximal LAD involvement A (7); Indication 5 A (7); Indication 5   newline 2 Vessel Disease  No proximal LAD involvement A (7); Indication 8 M (6); Indication 8   Proximal LAD involvement A (7); Indication 11 A (7); Indication 11   newline 3 Vessel Disease  Low disease complexity (e.g., focal stenoses, SYNTAX <=22) A (7);  Indication 17 A (8); Indication 17   Intermediate or high disease complexity (e.g., SYNTAX >=23) M (6); Indication 21 A (8); Indication 21   newline Left Main Disease  Isolated LMCA disease: ostial or midshaft A (7); Indication 24 A (9); Indication 24   Isolated LMCA disease: bifurcation involvement M (5); Indication 25 A (9); Indication 25   LMCA ostial or midshaft, concurrent low disease burden multivessel disease (e.g., 1-2 additional focal stenoses, SYNTAX <=22) A (7); Indication 26 A (9); Indication 26   LMCA ostial or midshaft, concurrent intermediate or high disease burden multivessel disease (e.g., 1-2 additional bifurcation stenoses, long stenoses, SYNTAX >=23) M (4); Indication 27 A (9); Indication 27   LMCA bifurcation involvement, concurrent low disease burden multivessel disease (e.g., 1-2 additional focal stenoses, SYNTAX <=22) M (5); Indication 28 A (9); Indication 28   LMCA bifurcation involvement, concurrent intermediate or high disease burden multivessel disease (e.g., 1-2 additional bifurcation stenoses, long stenoses, SYNTAX >=23) R (3); Indication 29 A (9); Indication [place]

## 2018-12-15 NOTE — Progress Notes (Signed)
2cc's of air removed from Tr band. 6cc's remain. Site appears normal.

## 2018-12-15 NOTE — Progress Notes (Signed)
2cc's removed from Tr band. 10cc's remain

## 2018-12-15 NOTE — Research (Signed)
ASTELLAS Research study: Presented the ASTELLAS research protocol to patient. Questions encouraged and answered. ICF and Pamphlet left for patient to review. My contact info given to patient for any questions. Research will follow up tomorrow.

## 2018-12-16 ENCOUNTER — Encounter (HOSPITAL_COMMUNITY): Payer: Self-pay | Admitting: Cardiology

## 2018-12-16 ENCOUNTER — Inpatient Hospital Stay (HOSPITAL_COMMUNITY): Payer: Medicare Other

## 2018-12-16 DIAGNOSIS — Z01810 Encounter for preprocedural cardiovascular examination: Secondary | ICD-10-CM

## 2018-12-16 LAB — COMPREHENSIVE METABOLIC PANEL
ALT: 25 U/L (ref 0–44)
ALT: 21 U/L (ref 0–44)
AST: 23 U/L (ref 15–41)
AST: 19 U/L (ref 15–41)
Albumin: 3.6 g/dL (ref 3.5–5.0)
Albumin: 3.5 g/dL (ref 3.5–5.0)
Alkaline Phosphatase: 82 U/L (ref 38–126)
Alkaline Phosphatase: 80 U/L (ref 38–126)
Anion gap: 8 (ref 5–15)
Anion gap: 7 (ref 5–15)
BUN: 10 mg/dL (ref 8–23)
BUN: 9 mg/dL (ref 8–23)
CO2: 23 mmol/L (ref 22–32)
CO2: 23 mmol/L (ref 22–32)
Calcium: 9.1 mg/dL (ref 8.9–10.3)
Calcium: 9 mg/dL (ref 8.9–10.3)
Chloride: 107 mmol/L (ref 98–111)
Chloride: 109 mmol/L (ref 98–111)
Creatinine, Ser: 0.73 mg/dL (ref 0.44–1.00)
Creatinine, Ser: 0.84 mg/dL (ref 0.44–1.00)
GFR calc Af Amer: >60 mL/min (ref >60)
GFR calc Af Amer: >60 mL/min (ref >60)
GFR calc non Af Amer: >60 mL/min (ref >60)
GFR calc non Af Amer: >60 mL/min (ref >60)
Glucose, Bld: 123 mg/dL — ABNORMAL HIGH (ref 70–99)
Glucose, Bld: 178 mg/dL — ABNORMAL HIGH (ref 70–99)
Potassium: 4 mmol/L (ref 3.5–5.1)
Potassium: 4.2 mmol/L (ref 3.5–5.1)
Sodium: 138 mmol/L (ref 135–145)
Sodium: 139 mmol/L (ref 135–145)
Total Bilirubin: 0.6 mg/dL (ref 0.3–1.2)
Total Bilirubin: 0.6 mg/dL (ref 0.3–1.2)
Total Protein: 6.3 g/dL — ABNORMAL LOW (ref 6.5–8.1)
Total Protein: 6.2 g/dL — ABNORMAL LOW (ref 6.5–8.1)

## 2018-12-16 LAB — URINALYSIS, COMPLETE (UACMP) WITH MICROSCOPIC
Bilirubin Urine: NEGATIVE
Glucose, UA: NEGATIVE mg/dL
Hgb urine dipstick: NEGATIVE
Ketones, ur: NEGATIVE mg/dL
Leukocytes,Ua: NEGATIVE
Nitrite: NEGATIVE
Protein, ur: NEGATIVE mg/dL
Specific Gravity, Urine: 1.006 (ref 1.005–1.030)
pH: 7 (ref 5.0–8.0)

## 2018-12-16 LAB — GLUCOSE, CAPILLARY
Glucose-Capillary: 104 mg/dL — ABNORMAL HIGH (ref 70–99)
Glucose-Capillary: 102 mg/dL — ABNORMAL HIGH (ref 70–99)
Glucose-Capillary: 115 mg/dL — ABNORMAL HIGH (ref 70–99)
Glucose-Capillary: 89 mg/dL (ref 70–99)

## 2018-12-16 LAB — TSH: TSH: 1.635 u[IU]/mL (ref 0.350–4.500)

## 2018-12-16 LAB — SURGICAL PCR SCREEN
MRSA, PCR: NEGATIVE
Staphylococcus aureus: NEGATIVE

## 2018-12-16 LAB — HEMOGLOBIN A1C
Hgb A1c MFr Bld: 5.6 % (ref 4.8–5.6)
Mean Plasma Glucose: 114 mg/dL

## 2018-12-16 LAB — ABO/RH: ABO/RH(D): A NEG

## 2018-12-16 MED ORDER — PHENYLEPHRINE HCL-NACL 20-0.9 MG/250ML-% IV SOLN
30.0000 ug/min | INTRAVENOUS | Status: AC
  Administered 2018-12-17: 15 ug/min via INTRAVENOUS
  Filled 2018-12-16: qty 250

## 2018-12-16 MED ORDER — TEMAZEPAM 15 MG PO CAPS
15.0000 mg | ORAL_CAPSULE | Freq: Once | ORAL | Status: AC | PRN
  Administered 2018-12-16: 15 mg via ORAL
  Filled 2018-12-16: qty 1

## 2018-12-16 MED ORDER — TRANEXAMIC ACID 1000 MG/10ML IV SOLN
1.5000 mg/kg/h | INTRAVENOUS | Status: AC
  Administered 2018-12-17: 1.5 mg/kg/h via INTRAVENOUS
  Filled 2018-12-16: qty 25

## 2018-12-16 MED ORDER — GLUTARALDEHYDE 0.625% SOAKING SOLUTION
TOPICAL | Status: DC
  Filled 2018-12-16: qty 50

## 2018-12-16 MED ORDER — TRANEXAMIC ACID (OHS) BOLUS VIA INFUSION
15.0000 mg/kg | INTRAVENOUS | Status: AC
  Administered 2018-12-17: 1219.5 mg via INTRAVENOUS
  Filled 2018-12-16: qty 1220

## 2018-12-16 MED ORDER — SODIUM CHLORIDE 0.9 % IV SOLN
1.5000 g | INTRAVENOUS | Status: AC
  Administered 2018-12-17: 1.5 g via INTRAVENOUS
  Administered 2018-12-17: .75 g via INTRAVENOUS
  Filled 2018-12-16: qty 1.5

## 2018-12-16 MED ORDER — DOPAMINE-DEXTROSE 3.2-5 MG/ML-% IV SOLN
0.0000 ug/kg/min | INTRAVENOUS | Status: DC
  Filled 2018-12-16: qty 250

## 2018-12-16 MED ORDER — NITROGLYCERIN IN D5W 200-5 MCG/ML-% IV SOLN
2.0000 ug/min | INTRAVENOUS | Status: AC
  Administered 2018-12-17: 5 ug/min via INTRAVENOUS
  Filled 2018-12-16: qty 250

## 2018-12-16 MED ORDER — SODIUM CHLORIDE 0.9 % IV SOLN
750.0000 mg | INTRAVENOUS | Status: DC
  Filled 2018-12-16: qty 750

## 2018-12-16 MED ORDER — SODIUM CHLORIDE 0.9 % IV SOLN
INTRAVENOUS | Status: DC
  Filled 2018-12-16: qty 30

## 2018-12-16 MED ORDER — PLASMA-LYTE 148 IV SOLN
INTRAVENOUS | Status: DC
  Filled 2018-12-16: qty 2.5

## 2018-12-16 MED ORDER — METOPROLOL TARTRATE 12.5 MG HALF TABLET
12.5000 mg | ORAL_TABLET | Freq: Once | ORAL | Status: DC
  Filled 2018-12-16: qty 1

## 2018-12-16 MED ORDER — NOREPINEPHRINE 4 MG/250ML-% IV SOLN
0.0000 ug/min | INTRAVENOUS | Status: DC
  Filled 2018-12-16: qty 250

## 2018-12-16 MED ORDER — VANCOMYCIN HCL 10 G IV SOLR
1250.0000 mg | INTRAVENOUS | Status: AC
  Administered 2018-12-17: 1250 mg via INTRAVENOUS
  Filled 2018-12-16: qty 1250

## 2018-12-16 MED ORDER — BISACODYL 5 MG PO TBEC
5.0000 mg | DELAYED_RELEASE_TABLET | Freq: Once | ORAL | Status: AC
  Administered 2018-12-16: 5 mg via ORAL
  Filled 2018-12-16: qty 1

## 2018-12-16 MED ORDER — POTASSIUM CHLORIDE 2 MEQ/ML IV SOLN
80.0000 meq | INTRAVENOUS | Status: DC
  Filled 2018-12-16: qty 40

## 2018-12-16 MED ORDER — CHLORHEXIDINE GLUCONATE 0.12 % MT SOLN
15.0000 mL | Freq: Once | OROMUCOSAL | Status: AC
  Administered 2018-12-17: 15 mL via OROMUCOSAL
  Filled 2018-12-16: qty 15

## 2018-12-16 MED ORDER — DEXMEDETOMIDINE HCL IN NACL 400 MCG/100ML IV SOLN
0.1000 ug/kg/h | INTRAVENOUS | Status: AC
  Administered 2018-12-17: .3 ug/kg/h via INTRAVENOUS
  Filled 2018-12-16: qty 100

## 2018-12-16 MED ORDER — EPINEPHRINE HCL 5 MG/250ML IV SOLN IN NS
0.0000 ug/min | INTRAVENOUS | Status: DC
  Filled 2018-12-16: qty 250

## 2018-12-16 MED ORDER — INSULIN REGULAR(HUMAN) IN NACL 100-0.9 UT/100ML-% IV SOLN
INTRAVENOUS | Status: AC
  Administered 2018-12-17: 1 [IU]/h via INTRAVENOUS
  Filled 2018-12-16: qty 100

## 2018-12-16 MED ORDER — CHLORHEXIDINE GLUCONATE 4 % EX LIQD
60.0000 mL | Freq: Once | CUTANEOUS | Status: AC
  Administered 2018-12-17: 4 via TOPICAL
  Filled 2018-12-16: qty 60

## 2018-12-16 MED ORDER — CHLORHEXIDINE GLUCONATE 4 % EX LIQD
60.0000 mL | Freq: Once | CUTANEOUS | Status: AC
  Administered 2018-12-16: 4 via TOPICAL
  Filled 2018-12-16: qty 60

## 2018-12-16 MED ORDER — MANNITOL 20 % IV SOLN
Freq: Once | INTRAVENOUS | Status: DC
  Filled 2018-12-16 (×2): qty 13

## 2018-12-16 MED ORDER — TRANEXAMIC ACID (OHS) PUMP PRIME SOLUTION
2.0000 mg/kg | INTRAVENOUS | Status: DC
  Filled 2018-12-16: qty 1.63

## 2018-12-16 MED ORDER — VANCOMYCIN HCL 1000 MG IV SOLR
INTRAVENOUS | Status: DC
  Filled 2018-12-16: qty 1000

## 2018-12-16 MED ORDER — NICOTINE 7 MG/24HR TD PT24
7.0000 mg | MEDICATED_PATCH | Freq: Every day | TRANSDERMAL | Status: DC
  Administered 2018-12-16 – 2018-12-23 (×7): 7 mg via TRANSDERMAL
  Filled 2018-12-16 (×7): qty 1

## 2018-12-16 MED ORDER — MILRINONE LACTATE IN DEXTROSE 20-5 MG/100ML-% IV SOLN
0.3000 ug/kg/min | INTRAVENOUS | Status: DC
  Filled 2018-12-16: qty 100

## 2018-12-16 NOTE — Progress Notes (Signed)
      [address]       [place] [address]             [phone]     CARDIOTHORACIC SURGERY PROGRESS NOTE  1 Day Post-Op  S/P Procedure(s) (LRB): LEFT HEART CATH AND CORONARY ANGIOGRAPHY (N/A)  Subjective: No chest pain  Objective: Vital signs in last 24 hours: Temp:  [97 F (36.1 C)-98.4 F (36.9 C)] 97 F (36.1 C) (09/09 1147) Pulse Rate:  [56-67] 58 (09/09 1147) Cardiac Rhythm: Heart block (09/09 0705) Resp:  [20] 20 (09/09 1147) BP: (128-153)/(70-78) 153/76 (09/09 1147) SpO2:  [98 %-100 %] 100 % (09/09 1147) Weight:  [81.3 kg] 81.3 kg (09/09 0340)  Physical Exam:  Rhythm:   sinus  Breath sounds: clear  Heart sounds:  RRR  Incisions:  N/A  Abdomen:  soft  Extremities:  warm   Intake/Output from previous day: 09/08 0701 - 09/09 0700 In: 521.3 [P.O.:240; I.V.:281.3] Out: 1201 [Urine:1201] Intake/Output this shift: Total I/O In: 480 [P.O.:480] Out: 1400 [Urine:1400]  Lab Results: No results for input(s): WBC, HGB, HCT, PLT in the last 72 hours. BMET:  Recent Labs    12/16/18 0839  NA 138  K 4.0  CL 107  CO2 23  GLUCOSE 123*  BUN 10  CREATININE 0.73  CALCIUM 9.1    CBG (last 3)  Recent Labs    12/16/18 0648 12/16/18 1103 12/16/18 1635  GLUCAP 104* 102* 115*   PT/INR:  No results for input(s): LABPROT, INR in the last 72 hours.  CXR:  Not done  Assessment/Plan: S/P Procedure(s) (LRB): LEFT HEART CATH AND CORONARY ANGIOGRAPHY (N/A)  I have again reviewed the indications, risks and potential benefits of surgery.  Expectations for her convalescence have been discussed.  All questions answered.  For OR in am.  I spent in excess of 15 minutes during the conduct of this hospital encounter and >50% of this time involved direct face-to-face encounter with the patient for counseling and/or coordination of their care.    Rexene Alberts, MD 12/16/2018 5:42 PM

## 2018-12-16 NOTE — Progress Notes (Signed)
Pre op vascular       has been completed. Preliminary results can be found under CV proc through chart review. Kyleah Pens[place]ene, BS, RDMS, RVT   

## 2018-12-16 NOTE — Progress Notes (Signed)
Discussed sternal precautions, IS (2200 mL max), mobility post op and d/c planning with pt. Gave her educational materials to review. She is very motivated and eager to walk post op. She previously lost almost 300 lbs by diet and exercise. Her son will be with her at home at d/c.  [place] CES, ACSM 9:23 AM 12/16/2018

## 2018-12-16 NOTE — Research (Signed)
ASTELLAS Visit 1 Yes No Inclusion  [x]  []  Institutional Review Board (IRB)/Independent Ethics Committee (IEC)-approved written informed consent and privacy language as per national regulations (e.g., [place] for [place] sites) must be obtained from the subject or legally authorized representative prior to any study-related procedures (including withdrawal of prohibited medication).  [x]  []  Subject agrees not to participate in another interventional study after signing the informed consent form (ICF) and until the end of study (EoS) visit has been completed.   [x]  []  Subject is ? 61 years of age at time of screening (visit   [x]  []  Subject undergoing non-emergent open chest cardiovascular surgery with use of CPB (i.e., CABG and/or valve surgery [including aortic root and ascending aorta surgery, without circulatory arrest]) within 4 weeks of screening (visit 1).  [x]  []  Subject has moderate/high risk of developing AKI following surgery: [x]  Only CABG, or single valve surgery: subject requires to have at least 2 AKI risk factors at screening [] Combined CABG and surgery for 1 or more valves: subject requires to have at least 1 AKI risk factor at screening [] Surgery for more than 1 cardiac valve: subject requires to have at least 1 AKI risk factor at screening [] Surgery for aortic root or ascending aorta without circulatory arrest: subject requires to have at least 1 AKI risk factor at screening  AKI risk factors: []  Age at screening of ? 70 years []  Documented history of eGFR < 60 mL/min per 1.73 m2 as per Modification of Diet in Renal Disease Study (MDRD) or CKD-EPI equation (or documented measured GFR assessment) [] o History needs to be present for at least 90 days prior to screening. Both SCr and eGFR need to be documented in the chart, and [] o eGFR at screening or baseline needs to be < 60 mL/min per 1.73 m2, as well as per CKD-EPI  equation. []  Documented history of congestive heart failure requiring hospitalization. This condition should exist for at least 90 days prior to screening. [x]  Documented history of diabetes mellitus (type 1 or 2) of ? 90 days prior to screening, and subject is on active antidiabetic medication treatment for ? 90 days. []  Documented history of proteinuria/albuminuria at any time point before screening [] o Urinary dipstick result of ? 2+, OR [] o Documented urinary albumin creatinine ratio measurement of ? 300 mg/g, or [] o Documented total quantity of protein in a 24-hour urine collection test ? 0.3 g/day.  [x]  []  Subject must have the ability, in the opinion of the investigator, and willingness to return for all scheduled visits and perform all assessments.  [x]  []  A female subject is eligible to participate if she is not pregnant see [Appendix 12.3 Contraception Requirements] and at least 1 of the following conditions applies: []  Not a woman of childbearing potential (WOCBP) as defined in [Appendix 12.3 Contraception Requirements] OR []  WOCBP who agrees to follow the contraceptive guidance as defined in [Appendix 12.3 Contraception Requirements] throughout the treatment period and for at least 30 days after the final study drug administration.  []  []  Female subject must agree not to breastfeed starting at screening and throughout the study period, and for 30 days after the final study drug administration.  [x]  []  Female subject must not donate ova starting at screening and throughout the study period, and for 30 days after the final study drug administration.  []  []  A female subject with female partner(s) of childbearing potential must agree to use contraception as detailed in [Appendix 12.3 Contraception  Requirements] during the treatment period and for at least 30 days after the final study drug administration.  []  []  A female subject must not donate sperm during the treatment period and for at least  30 days after the final study drug administration.  []  []  Female subject with a pregnant or breastfeeding partner(s) must agree to remain abstinent or use a condom for the duration of the pregnancy or time partner is breastfeeding throughout the study period and for 30 days after the final study drug administration.    Exclusion  []  [x]  Subject has received investigational drug within 30 days or 5 half-lives, whichever is longer, prior to screening.  []  [x]  Subject has use of RRT within 30 days prior to screening.  []  [x]  Subject has a known history of eGFR < 30 mL/min per 1.73 m2 as per CKD-EPI or MDRD equation at any time before screening.  []  [x]  Subject has a prior kidney transplantation.  []  [x]  Subject has a known or suspected glomerulonephritis (other than Diabetic Kidney Disease).  []  [x]  Subject has confirmed or treated endocarditis, or other current active infection requiring antibiotic treatment, within 30 days prior to screening.  []  [x]  Subject is using prohibited medications as specified in the concomitant medication section of the protocol [Section 5.1.3.1 Prohibited and Restricted Treatment].  []  [x]  Subject has a prior history of intravenous drug abuse within 1 year prior to screening.  []  [x]  Subject has a known chronic liver disorder with Child-Pugh B or C classification.  []  [x]  Subject has any of the following abnormal liver or kidney function parameters (as assessed in visit 1 sample. If 1 of results of central or local laboratory testing fulfill the criterion, the subjects will be eligible.): []  Alanine aminotransferase (ALT), aspartate aminotransferase (AST) to > 2 times the upper limit of normal (ULN) or bilirubin increased to > 1.5 times the ULN. []  eGFR < 30 mL/min per 1.73 m2 as per CKD-EPI equation.  []  [x]  Subject has use of left ventricular assist device (LVAD), intra-aortic balloon pump (IABP) or other cardiac devices, or catecholamines within 7 days prior to screening.   []  [x]  Subject has surgery scheduled to be performed without CPB (i.e., "Off-Pump" surgery).  []  [x]  Subject has surgery scheduled to be performed under conditions of circulatory arrest, including planned deep hypothermic circulatory arrest.  []  [x]  Subject has surgery scheduled for aortic dissection.  []  [x]  Subject has surgery for a condition that is immediately life-threatening as per the discretion of the investigator.  []  [x]  Subject has surgery scheduled to correct major congenital heart defect.   []  [x]  Subject has current or previous malignant disease. Subjects with a history of cancer are considered eligible if the subject has undergone therapy and the subject has been considered disease free or progression free for at least 5 years. Subject with completely excised basal cell carcinoma or squamous cell carcinoma of the skin and completely excised cervical cancer in situ are also considered eligible.    Preoperatively at the Day of Surgery:  []  [x]  Exclusion criteria 1 to 17 are applicable at this time.  []  [x]  Subject has AKI (any stage) present at presurgery baseline at the discretion of the investigator.  []  [x]  Subject has known or suspected infection/sepsis at time of presurgery baseline at the discretion of investigator.    Perioperative Exclusion Criteria:  []  [x]  Subject requires Extracorporeal Membrane Oxygenation (ECMO) during or after completion of surgery.  []  [x]  Subject requires ventricular assist device (VAD)  during or after completion of surgery.  []  [x]  Subject has surgery performed "Off-Pump" at any time during surgery.    General:  []  [x]  Subject has other condition, which, in the investigator's opinion, makes the subject unsuitable for study participation.  []  [x]  Female subject who is pregnant or lactating or has a positive pregnancy test within 72 hours prior to screening and/or randomization, has been pregnant within 6 months before screening assessment or  breastfeeding within 3 months before screening or who is planning to become pregnant within the total study period.  []  [x]  Subject has a known or suspected hypersensitivity to [id number] or any components of the formulation used.  []  [x]  Subject is an employee of the [employer] or the [employer]) involved in the study.  [facility] Labs Visit 1 Screening obtained:

## 2018-12-16 NOTE — Research (Signed)
[facility]AS Informed Consent   Subject Name: Christine Skinner  Subject met inclusion and exclusion criteria.  The informed consent form, study requirements and expectations were reviewed with the subject and questions and concerns were addressed prior to the signing of the consent form.  The subject verbalized understanding of the trail requirements.  The subject agreed to participate in the ASTELLAS trial and signed the informed consent.  The informed consent was obtained prior to performance of any protocol-specific procedures for the subject.  A copy of the signed informed consent was given to the subject and a copy was placed in the subject's medical record.  [name] W 12/16/2018, 0930

## 2018-12-16 NOTE — Progress Notes (Signed)
Subjective:  No chest pain. Anxious about the upcoming surgery.  Objective:  Vital Signs in the last 24 hours: Temp:  [98 F (36.7 C)-98.9 F (37.2 C)] 98.4 F (36.9 C) (09/09 0340) Pulse Rate:  [0-288] 67 (09/09 0340) Resp:  [0-53] 20 (09/09 0340) BP: (128-188)/(57-90) 145/76 (09/09 0340) SpO2:  [0 %-100 %] 99 % (09/09 0340) Weight:  [81.3 kg-82.1 kg] 81.3 kg (09/09 0340)  Intake/Output from previous day: 09/08 0701 - 09/09 0700 In: 521.3 [P.O.:240; I.V.:281.3] Out: 1201 [Urine:1201]  Physical Exam Constitutional: She is oriented to person, place, and time. She appears well-developed and well-nourished. No distress.  HENT:  Head: Normocephalic and atraumatic.  Eyes: Pupils are equal, round, and reactive to light. Conjunctivae are normal.  Neck: No JVD present.  Cardiovascular: Normal rate, regular rhythm and intact distal pulses.  Murmur heard.  Holodiastolic murmur is present with a grade of 3/6. Pulmonary/Chest: Effort normal and breath sounds normal. She has no wheezes. She has no rales.  Abdominal: Soft. Bowel sounds are normal. There is no rebound.  Musculoskeletal:        General: No edema.  Lymphadenopathy:    She has no cervical adenopathy.  Neurological: She is alert and oriented to person, place, and time. No cranial nerve deficit.  Skin: Skin is warm and dry.  Psychiatric: She has a normal mood and affect.  Nursing note and vitals reviewed.  Lab Results: BMP Recent Labs    12/03/18 1408  NA 143  K 4.8  CL 106  CO2 23  GLUCOSE 91  BUN 12  CREATININE 0.92  CALCIUM 9.1  GFRNONAA 67  GFRAA 78    CBC No results for input(s): WBC, RBC, HGB, HCT, PLT, MCV, MCH, MCHC, RDW, LYMPHSABS, MONOABS, EOSABS, BASOSABS in the last 168 hours.  Invalid input(s): NEUTRABS  HEMOGLOBIN A1C Lab Results  Component Value Date   HGBA1C 5.6 12/15/2018   MPG 114 12/15/2018   Lipid Panel     Component Value Date/Time   CHOL 158 12/10/2016 1446   TRIG 109  12/10/2016 1446   HDL 52 12/10/2016 1446   CHOLHDL 3.0 12/10/2016 1446   LDLCALC 84 12/10/2016 1446    CARDIAC STUDIES: Lexiscan Myoview stress test 11/16/2018: Lexiscan stress test was performed. Stress EKG is non-diagnostic, as this is pharmacological stress test. SPCT images reveal medium sized, severe intensity, mildly reversible perfusion defect in basal anterior/anteroseptal myocardium; and a small sized, severe intensity, moderately reversible perfusion defect in inferior apical myocardium. Severely dilated LV with stress LVEF 27%.  High risk study.   Echocardiogram 11/24/2018: Normal LV systolic function with EF 59%. Left ventricle cavity is normal in size. Mild to moderate concentric hypertrophy of the left ventricle. Normal global wall motion.  Left atrial cavity is mildly dilated. Thickened interatrial septum. Trileaflet aortic valve with mild aortic valve leaflet calcification. Moderate (Grade III) aortic regurgitation. Mild (Grade I) mitral regurgitation. Mild tricuspid regurgitation. Estimated pulmonary artery systolic pressure is 20 mmHg.  CT scan-10/27/2018-aortic and coronary atherosclerosis Left adrenal adenoma-2.31.7 cm. COPD.   Assessment/Plan  63 y/o Caucasian female with hypertension, type 2 DM, tobacco abuse, family h/o CAD, mod AI, severe multivessel CAD  CAD:  Severe multivessel CAD, including distal left main bifurcation stenosis. Exertional, as well as occasional resting anginal symptoms. Admitted to telemetry with plan for CABG this admission. Continue aspirin, metoprolol, sublingual nitroglycerin as needed, statin. Possibly, CABG + AVR given mod AI. Incentive spirometer  Hypertension: Continue baseline medication lisinopril and metoprolol. PRN hydralazine  Type 2 diabetes mellitus: Sliding scale insulin.  Hold metformin.   Tobacco abuse: Nicotine patch   Elder NegusManish J Leinani Lisbon, M.D. 12/16/2018, 8:09 AM [facility]  [place] Pager: [phone] Office: [phone] If no answer: [phone]

## 2018-12-17 ENCOUNTER — Encounter (HOSPITAL_COMMUNITY)
Admission: RE | Disposition: A | Discharge status: Home / Self Care | Payer: Self-pay | Source: Home / Self Care | Attending: Thoracic Surgery (Cardiothoracic Vascular Surgery)

## 2018-12-17 ENCOUNTER — Inpatient Hospital Stay (HOSPITAL_COMMUNITY): Payer: Medicare Other

## 2018-12-17 ENCOUNTER — Inpatient Hospital Stay (HOSPITAL_COMMUNITY): Payer: Medicare Other | Admitting: Anesthesiology

## 2018-12-17 ENCOUNTER — Encounter (HOSPITAL_COMMUNITY): Payer: Self-pay | Admitting: Thoracic Surgery (Cardiothoracic Vascular Surgery)

## 2018-12-17 DIAGNOSIS — Z951 Presence of aortocoronary bypass graft: Secondary | ICD-10-CM

## 2018-12-17 DIAGNOSIS — I2510 Atherosclerotic heart disease of native coronary artery without angina pectoris: Secondary | ICD-10-CM

## 2018-12-17 DIAGNOSIS — I4891 Unspecified atrial fibrillation: Secondary | ICD-10-CM

## 2018-12-17 HISTORY — PX: TEE WITHOUT CARDIOVERSION: SHX5443

## 2018-12-17 HISTORY — DX: Presence of aortocoronary bypass graft: Z95.1

## 2018-12-17 HISTORY — PX: CORONARY ARTERY BYPASS GRAFT: SHX141

## 2018-12-17 LAB — CBC
HCT: 40.8 % (ref 36.0–46.0)
HCT: 27.7 % — ABNORMAL LOW (ref 36.0–46.0)
HCT: 25.6 % — ABNORMAL LOW (ref 36.0–46.0)
Hemoglobin: 13.5 g/dL (ref 12.0–15.0)
Hemoglobin: 9.2 g/dL — ABNORMAL LOW (ref 12.0–15.0)
Hemoglobin: 8.4 g/dL — ABNORMAL LOW (ref 12.0–15.0)
MCH: 31.3 pg (ref 26.0–34.0)
MCH: 31.7 pg (ref 26.0–34.0)
MCH: 31.9 pg (ref 26.0–34.0)
MCHC: 33.1 g/dL (ref 30.0–36.0)
MCHC: 33.2 g/dL (ref 30.0–36.0)
MCHC: 32.8 g/dL (ref 30.0–36.0)
MCV: 94.4 fL (ref 80.0–100.0)
MCV: 95.5 fL (ref 80.0–100.0)
MCV: 97.3 fL (ref 80.0–100.0)
Platelets: 194 10*3/uL (ref 150–400)
Platelets: 127 10*3/uL — ABNORMAL LOW (ref 150–400)
Platelets: 115 10*3/uL — ABNORMAL LOW (ref 150–400)
RBC: 4.32 MIL/uL (ref 3.87–5.11)
RBC: 2.9 MIL/uL — ABNORMAL LOW (ref 3.87–5.11)
RBC: 2.63 MIL/uL — ABNORMAL LOW (ref 3.87–5.11)
RDW: 13.1 % (ref 11.5–15.5)
RDW: 13.2 % (ref 11.5–15.5)
RDW: 13.2 % (ref 11.5–15.5)
WBC: 7.5 10*3/uL (ref 4.0–10.5)
WBC: 15.2 10*3/uL — ABNORMAL HIGH (ref 4.0–10.5)
WBC: 9.8 10*3/uL (ref 4.0–10.5)
nRBC: 0 % (ref 0.0–0.2)
nRBC: 0 % (ref 0.0–0.2)
nRBC: 0 % (ref 0.0–0.2)

## 2018-12-17 LAB — POCT I-STAT 7, (LYTES, BLD GAS, ICA,H+H)
Acid-base deficit: 1 mmol/L (ref 0.0–2.0)
Acid-base deficit: 4 mmol/L — ABNORMAL HIGH (ref 0.0–2.0)
Acid-base deficit: 5 mmol/L — ABNORMAL HIGH (ref 0.0–2.0)
Acid-base deficit: 5 mmol/L — ABNORMAL HIGH (ref 0.0–2.0)
Bicarbonate: 24.9 mmol/L (ref 20.0–28.0)
Bicarbonate: 22 mmol/L (ref 20.0–28.0)
Bicarbonate: 20.7 mmol/L (ref 20.0–28.0)
Bicarbonate: 19.9 mmol/L — ABNORMAL LOW (ref 20.0–28.0)
Calcium, Ion: 1.08 mmol/L — ABNORMAL LOW (ref 1.15–1.40)
Calcium, Ion: 1.1 mmol/L — ABNORMAL LOW (ref 1.15–1.40)
Calcium, Ion: 1.14 mmol/L — ABNORMAL LOW (ref 1.15–1.40)
Calcium, Ion: 1.14 mmol/L — ABNORMAL LOW (ref 1.15–1.40)
HCT: 26 % — ABNORMAL LOW (ref 36.0–46.0)
HCT: 27 % — ABNORMAL LOW (ref 36.0–46.0)
HCT: 26 % — ABNORMAL LOW (ref 36.0–46.0)
HCT: 26 % — ABNORMAL LOW (ref 36.0–46.0)
Hemoglobin: 8.8 g/dL — ABNORMAL LOW (ref 12.0–15.0)
Hemoglobin: 9.2 g/dL — ABNORMAL LOW (ref 12.0–15.0)
Hemoglobin: 8.8 g/dL — ABNORMAL LOW (ref 12.0–15.0)
Hemoglobin: 8.8 g/dL — ABNORMAL LOW (ref 12.0–15.0)
O2 Saturation: 100 %
O2 Saturation: 97 %
O2 Saturation: 99 %
O2 Saturation: 99 %
Patient temperature: 35.9
Patient temperature: 36.3
Patient temperature: 36.7
Potassium: 3.7 mmol/L (ref 3.5–5.1)
Potassium: 4 mmol/L (ref 3.5–5.1)
Potassium: 4 mmol/L (ref 3.5–5.1)
Potassium: 4 mmol/L (ref 3.5–5.1)
Sodium: 141 mmol/L (ref 135–145)
Sodium: 143 mmol/L (ref 135–145)
Sodium: 143 mmol/L (ref 135–145)
Sodium: 143 mmol/L (ref 135–145)
TCO2: 26 mmol/L (ref 22–32)
TCO2: 23 mmol/L (ref 22–32)
TCO2: 22 mmol/L (ref 22–32)
TCO2: 21 mmol/L — ABNORMAL LOW (ref 22–32)
pCO2 arterial: 45.2 mmHg (ref 32.0–48.0)
pCO2 arterial: 43.1 mmHg (ref 32.0–48.0)
pCO2 arterial: 39.5 mmHg (ref 32.0–48.0)
pCO2 arterial: 36.7 mmHg (ref 32.0–48.0)
pH, Arterial: 7.349 — ABNORMAL LOW (ref 7.350–7.450)
pH, Arterial: 7.311 — ABNORMAL LOW (ref 7.350–7.450)
pH, Arterial: 7.324 — ABNORMAL LOW (ref 7.350–7.450)
pH, Arterial: 7.341 — ABNORMAL LOW (ref 7.350–7.450)
pO2, Arterial: 339 mmHg — ABNORMAL HIGH (ref 83.0–108.0)
pO2, Arterial: 96 mmHg (ref 83.0–108.0)
pO2, Arterial: 148 mmHg — ABNORMAL HIGH (ref 83.0–108.0)
pO2, Arterial: 132 mmHg — ABNORMAL HIGH (ref 83.0–108.0)

## 2018-12-17 LAB — BASIC METABOLIC PANEL
Anion gap: 11 (ref 5–15)
Anion gap: 8 (ref 5–15)
BUN: 10 mg/dL (ref 8–23)
BUN: 9 mg/dL (ref 8–23)
CO2: 19 mmol/L — ABNORMAL LOW (ref 22–32)
CO2: 19 mmol/L — ABNORMAL LOW (ref 22–32)
Calcium: 9.2 mg/dL (ref 8.9–10.3)
Calcium: 7.6 mg/dL — ABNORMAL LOW (ref 8.9–10.3)
Chloride: 109 mmol/L (ref 98–111)
Chloride: 113 mmol/L — ABNORMAL HIGH (ref 98–111)
Creatinine, Ser: 0.84 mg/dL (ref 0.44–1.00)
Creatinine, Ser: 0.76 mg/dL (ref 0.44–1.00)
GFR calc Af Amer: >60 mL/min (ref >60)
GFR calc Af Amer: >60 mL/min (ref >60)
GFR calc non Af Amer: >60 mL/min (ref >60)
GFR calc non Af Amer: >60 mL/min (ref >60)
Glucose, Bld: 93 mg/dL (ref 70–99)
Glucose, Bld: 125 mg/dL — ABNORMAL HIGH (ref 70–99)
Potassium: 3.8 mmol/L (ref 3.5–5.1)
Potassium: 3.9 mmol/L (ref 3.5–5.1)
Sodium: 139 mmol/L (ref 135–145)
Sodium: 140 mmol/L (ref 135–145)

## 2018-12-17 LAB — POCT I-STAT 4, (NA,K, GLUC, HGB,HCT)
Glucose, Bld: 107 mg/dL — ABNORMAL HIGH (ref 70–99)
Glucose, Bld: 110 mg/dL — ABNORMAL HIGH (ref 70–99)
Glucose, Bld: 117 mg/dL — ABNORMAL HIGH (ref 70–99)
Glucose, Bld: 117 mg/dL — ABNORMAL HIGH (ref 70–99)
Glucose, Bld: 103 mg/dL — ABNORMAL HIGH (ref 70–99)
HCT: 33 % — ABNORMAL LOW (ref 36.0–46.0)
HCT: 32 % — ABNORMAL LOW (ref 36.0–46.0)
HCT: 25 % — ABNORMAL LOW (ref 36.0–46.0)
HCT: 27 % — ABNORMAL LOW (ref 36.0–46.0)
HCT: 22 % — ABNORMAL LOW (ref 36.0–46.0)
Hemoglobin: 11.2 g/dL — ABNORMAL LOW (ref 12.0–15.0)
Hemoglobin: 10.9 g/dL — ABNORMAL LOW (ref 12.0–15.0)
Hemoglobin: 8.5 g/dL — ABNORMAL LOW (ref 12.0–15.0)
Hemoglobin: 9.2 g/dL — ABNORMAL LOW (ref 12.0–15.0)
Hemoglobin: 7.5 g/dL — ABNORMAL LOW (ref 12.0–15.0)
Potassium: 3.8 mmol/L (ref 3.5–5.1)
Potassium: 3.6 mmol/L (ref 3.5–5.1)
Potassium: 4.5 mmol/L (ref 3.5–5.1)
Potassium: 4.6 mmol/L (ref 3.5–5.1)
Potassium: 4.4 mmol/L (ref 3.5–5.1)
Sodium: 140 mmol/L (ref 135–145)
Sodium: 141 mmol/L (ref 135–145)
Sodium: 139 mmol/L (ref 135–145)
Sodium: 139 mmol/L (ref 135–145)
Sodium: 141 mmol/L (ref 135–145)

## 2018-12-17 LAB — GLUCOSE, CAPILLARY
Glucose-Capillary: 113 mg/dL — ABNORMAL HIGH (ref 70–99)
Glucose-Capillary: 115 mg/dL — ABNORMAL HIGH (ref 70–99)
Glucose-Capillary: 115 mg/dL — ABNORMAL HIGH (ref 70–99)
Glucose-Capillary: 117 mg/dL — ABNORMAL HIGH (ref 70–99)
Glucose-Capillary: 121 mg/dL — ABNORMAL HIGH (ref 70–99)
Glucose-Capillary: 128 mg/dL — ABNORMAL HIGH (ref 70–99)
Glucose-Capillary: 129 mg/dL — ABNORMAL HIGH (ref 70–99)
Glucose-Capillary: 130 mg/dL — ABNORMAL HIGH (ref 70–99)
Glucose-Capillary: 131 mg/dL — ABNORMAL HIGH (ref 70–99)
Glucose-Capillary: 140 mg/dL — ABNORMAL HIGH (ref 70–99)
Glucose-Capillary: 140 mg/dL — ABNORMAL HIGH (ref 70–99)
Glucose-Capillary: 153 mg/dL — ABNORMAL HIGH (ref 70–99)
Glucose-Capillary: 85 mg/dL (ref 70–99)

## 2018-12-17 LAB — HEMOGLOBIN AND HEMATOCRIT, BLOOD
HCT: 25.2 % — ABNORMAL LOW (ref 36.0–46.0)
Hemoglobin: 8.4 g/dL — ABNORMAL LOW (ref 12.0–15.0)

## 2018-12-17 LAB — PROTIME-INR
INR: 1 (ref 0.8–1.2)
INR: 1.4 — ABNORMAL HIGH (ref 0.8–1.2)
Prothrombin Time: 13.1 seconds (ref 11.4–15.2)
Prothrombin Time: 17.3 seconds — ABNORMAL HIGH (ref 11.4–15.2)

## 2018-12-17 LAB — APTT
aPTT: 32 seconds (ref 24–36)
aPTT: 34 seconds (ref 24–36)

## 2018-12-17 LAB — PLATELET COUNT: Platelets: 139 10*3/uL — ABNORMAL LOW (ref 150–400)

## 2018-12-17 LAB — MAGNESIUM: Magnesium: 2.5 mg/dL — ABNORMAL HIGH (ref 1.7–2.4)

## 2018-12-17 SURGERY — CORONARY ARTERY BYPASS GRAFTING (CABG)
Anesthesia: General | Site: Chest

## 2018-12-17 MED ORDER — PROPOFOL 10 MG/ML IV BOLUS
INTRAVENOUS | Status: DC | PRN
  Administered 2018-12-17: 130 mg via INTRAVENOUS

## 2018-12-17 MED ORDER — PROTAMINE SULFATE 10 MG/ML IV SOLN
INTRAVENOUS | Status: AC
  Filled 2018-12-17: qty 25

## 2018-12-17 MED ORDER — ORAL CARE MOUTH RINSE
15.0000 mL | OROMUCOSAL | Status: DC
  Administered 2018-12-17: 16:00:00 15 mL via OROMUCOSAL

## 2018-12-17 MED ORDER — LACTATED RINGERS IV SOLN: INTRAVENOUS | Status: DC

## 2018-12-17 MED ORDER — PROPOFOL 10 MG/ML IV BOLUS
INTRAVENOUS | Status: AC
  Filled 2018-12-17: qty 20

## 2018-12-17 MED ORDER — LACTATED RINGERS IV SOLN
INTRAVENOUS | Status: DC
  Administered 2018-12-17: 15:00:00 via INTRAVENOUS

## 2018-12-17 MED ORDER — BISACODYL 10 MG RE SUPP: 10.0000 mg | Freq: Every day | RECTAL | Status: DC

## 2018-12-17 MED ORDER — METOPROLOL TARTRATE 12.5 MG HALF TABLET
12.5000 mg | ORAL_TABLET | Freq: Two times a day (BID) | ORAL | Status: DC
  Administered 2018-12-18 – 2018-12-19 (×2): 12.5 mg via ORAL
  Filled 2018-12-17 (×2): qty 1

## 2018-12-17 MED ORDER — HEMOSTATIC AGENTS (NO CHARGE) OPTIME
TOPICAL | Status: DC | PRN
  Administered 2018-12-17: 1 via TOPICAL

## 2018-12-17 MED ORDER — METOPROLOL TARTRATE 5 MG/5ML IV SOLN: 2.5000 mg | INTRAVENOUS | Status: DC | PRN

## 2018-12-17 MED ORDER — MIDAZOLAM HCL 5 MG/5ML IJ SOLN
INTRAMUSCULAR | Status: DC | PRN
  Administered 2018-12-17: 2 mg via INTRAVENOUS
  Administered 2018-12-17: 1 mg via INTRAVENOUS
  Administered 2018-12-17: 2 mg via INTRAVENOUS
  Administered 2018-12-17: 1 mg via INTRAVENOUS
  Administered 2018-12-17: 3 mg via INTRAVENOUS
  Administered 2018-12-17: 1 mg via INTRAVENOUS

## 2018-12-17 MED ORDER — CHLORHEXIDINE GLUCONATE 0.12 % MT SOLN
15.0000 mL | OROMUCOSAL | Status: AC
  Administered 2018-12-17: 15 mL via OROMUCOSAL

## 2018-12-17 MED ORDER — ALBUMIN HUMAN 5 % IV SOLN
INTRAVENOUS | Status: DC | PRN
  Administered 2018-12-17: 12:00:00 via INTRAVENOUS

## 2018-12-17 MED ORDER — LACTATED RINGERS IV SOLN
INTRAVENOUS | Status: DC | PRN
  Administered 2018-12-17: 07:00:00 via INTRAVENOUS

## 2018-12-17 MED ORDER — DOCUSATE SODIUM 100 MG PO CAPS
200.0000 mg | ORAL_CAPSULE | Freq: Every day | ORAL | Status: DC
  Administered 2018-12-18 – 2018-12-20 (×3): 200 mg via ORAL
  Filled 2018-12-17 (×3): qty 2

## 2018-12-17 MED ORDER — FENTANYL CITRATE (PF) 250 MCG/5ML IJ SOLN
INTRAMUSCULAR | Status: AC
  Filled 2018-12-17: qty 25

## 2018-12-17 MED ORDER — SODIUM CHLORIDE (PF) 0.9 % IJ SOLN
OROMUCOSAL | Status: DC | PRN
  Administered 2018-12-17 (×3): 4 mL via TOPICAL

## 2018-12-17 MED ORDER — HEPARIN SODIUM (PORCINE) 1000 UNIT/ML IJ SOLN
INTRAMUSCULAR | Status: AC
  Filled 2018-12-17: qty 1

## 2018-12-17 MED ORDER — ASPIRIN EC 325 MG PO TBEC: 325.0000 mg | DELAYED_RELEASE_TABLET | Freq: Every day | ORAL | Status: DC

## 2018-12-17 MED ORDER — PHENYLEPHRINE 40 MCG/ML (10ML) SYRINGE FOR IV PUSH (FOR BLOOD PRESSURE SUPPORT)
PREFILLED_SYRINGE | INTRAVENOUS | Status: AC
  Filled 2018-12-17: qty 10

## 2018-12-17 MED ORDER — PANTOPRAZOLE SODIUM 40 MG PO TBEC: 40.0000 mg | DELAYED_RELEASE_TABLET | Freq: Every day | ORAL | Status: DC

## 2018-12-17 MED ORDER — ORAL CARE MOUTH RINSE
15.0000 mL | Freq: Two times a day (BID) | OROMUCOSAL | Status: DC
  Administered 2018-12-17 – 2018-12-23 (×11): 15 mL via OROMUCOSAL

## 2018-12-17 MED ORDER — INSULIN REGULAR BOLUS VIA INFUSION
0.0000 [IU] | Freq: Three times a day (TID) | INTRAVENOUS | Status: DC
  Filled 2018-12-17: qty 10

## 2018-12-17 MED ORDER — MIDAZOLAM HCL (PF) 10 MG/2ML IJ SOLN
INTRAMUSCULAR | Status: AC
  Filled 2018-12-17: qty 2

## 2018-12-17 MED ORDER — SODIUM CHLORIDE 0.9 % IV SOLN
INTRAVENOUS | Status: DC
  Administered 2018-12-17: 14:00:00 via INTRAVENOUS

## 2018-12-17 MED ORDER — SODIUM CHLORIDE 0.9 % IV SOLN
INTRAVENOUS | Status: AC
  Administered 2018-12-17: 13:00:00 via INTRAVENOUS

## 2018-12-17 MED ORDER — SODIUM CHLORIDE 0.45 % IV SOLN
INTRAVENOUS | Status: DC | PRN
  Administered 2018-12-17: 13:00:00 via INTRAVENOUS

## 2018-12-17 MED ORDER — FAMOTIDINE IN NACL 20-0.9 MG/50ML-% IV SOLN
20.0000 mg | Freq: Two times a day (BID) | INTRAVENOUS | Status: AC
  Administered 2018-12-17 (×2): 20 mg via INTRAVENOUS
  Filled 2018-12-17: qty 50

## 2018-12-17 MED ORDER — MAGNESIUM SULFATE 4 GM/100ML IV SOLN
4.0000 g | Freq: Once | INTRAVENOUS | Status: AC
  Administered 2018-12-17: 4 g via INTRAVENOUS
  Filled 2018-12-17: qty 100

## 2018-12-17 MED ORDER — ACETAMINOPHEN 160 MG/5ML PO SOLN: 1000.0000 mg | Freq: Four times a day (QID) | ORAL | Status: DC

## 2018-12-17 MED ORDER — HEPARIN SODIUM (PORCINE) 1000 UNIT/ML IJ SOLN
INTRAMUSCULAR | Status: DC | PRN
  Administered 2018-12-17: 3000 [IU] via INTRAVENOUS
  Administered 2018-12-17: 21000 [IU] via INTRAVENOUS

## 2018-12-17 MED ORDER — NITROGLYCERIN IN D5W 200-5 MCG/ML-% IV SOLN: 0.0000 ug/min | INTRAVENOUS | Status: DC

## 2018-12-17 MED ORDER — PLASMA-LYTE 148 IV SOLN
INTRAVENOUS | Status: DC | PRN
  Administered 2018-12-17: 500 mL via INTRAVASCULAR

## 2018-12-17 MED ORDER — MIDAZOLAM HCL 2 MG/2ML IJ SOLN: 2.0000 mg | INTRAMUSCULAR | Status: DC | PRN

## 2018-12-17 MED ORDER — ACETAMINOPHEN 650 MG RE SUPP
650.0000 mg | Freq: Once | RECTAL | Status: AC
  Administered 2018-12-17: 650 mg via RECTAL

## 2018-12-17 MED ORDER — LACTATED RINGERS IV SOLN: 500.0000 mL | Freq: Once | INTRAVENOUS | Status: DC | PRN

## 2018-12-17 MED ORDER — PHENYLEPHRINE HCL-NACL 20-0.9 MG/250ML-% IV SOLN: 0.0000 ug/min | INTRAVENOUS | Status: DC

## 2018-12-17 MED ORDER — ROCURONIUM BROMIDE 10 MG/ML (PF) SYRINGE
PREFILLED_SYRINGE | INTRAVENOUS | Status: AC
  Filled 2018-12-17: qty 30

## 2018-12-17 MED ORDER — ALBUMIN HUMAN 5 % IV SOLN
250.0000 mL | INTRAVENOUS | Status: AC | PRN
  Administered 2018-12-17 (×4): 12.5 g via INTRAVENOUS
  Filled 2018-12-17: qty 500

## 2018-12-17 MED ORDER — ONDANSETRON HCL 4 MG/2ML IJ SOLN
4.0000 mg | Freq: Four times a day (QID) | INTRAMUSCULAR | Status: DC | PRN
  Administered 2018-12-17 – 2018-12-20 (×4): 4 mg via INTRAVENOUS
  Filled 2018-12-17 (×4): qty 2

## 2018-12-17 MED ORDER — SODIUM CHLORIDE 0.9 % IV SOLN: 250.0000 mL | INTRAVENOUS | Status: DC

## 2018-12-17 MED ORDER — 0.9 % SODIUM CHLORIDE (POUR BTL) OPTIME
TOPICAL | Status: DC | PRN
  Administered 2018-12-17: 5000 mL

## 2018-12-17 MED ORDER — ASPIRIN 81 MG PO CHEW: 324.0000 mg | CHEWABLE_TABLET | Freq: Every day | ORAL | Status: DC

## 2018-12-17 MED ORDER — SODIUM CHLORIDE 0.9 % IV SOLN
1.5000 g | Freq: Two times a day (BID) | INTRAVENOUS | Status: AC
  Administered 2018-12-17 – 2018-12-19 (×4): 1.5 g via INTRAVENOUS
  Filled 2018-12-17 (×4): qty 1.5

## 2018-12-17 MED ORDER — CHLORHEXIDINE GLUCONATE 0.12% ORAL RINSE (MEDLINE KIT): 15.0000 mL | Freq: Two times a day (BID) | OROMUCOSAL | Status: DC

## 2018-12-17 MED ORDER — DEXMEDETOMIDINE HCL IN NACL 200 MCG/50ML IV SOLN
0.0000 ug/kg/h | INTRAVENOUS | Status: DC
  Administered 2018-12-17: 14:00:00 0.5 ug/kg/h via INTRAVENOUS
  Filled 2018-12-17: qty 50

## 2018-12-17 MED ORDER — INSULIN REGULAR(HUMAN) IN NACL 100-0.9 UT/100ML-% IV SOLN: INTRAVENOUS | Status: DC

## 2018-12-17 MED ORDER — METOPROLOL TARTRATE 25 MG/10 ML ORAL SUSPENSION: 12.5000 mg | Freq: Two times a day (BID) | ORAL | Status: DC

## 2018-12-17 MED ORDER — FENTANYL CITRATE (PF) 250 MCG/5ML IJ SOLN
INTRAMUSCULAR | Status: DC | PRN
  Administered 2018-12-17 (×2): 50 ug via INTRAVENOUS
  Administered 2018-12-17: 25 ug via INTRAVENOUS
  Administered 2018-12-17: 150 ug via INTRAVENOUS
  Administered 2018-12-17 (×5): 100 ug via INTRAVENOUS
  Administered 2018-12-17: 25 ug via INTRAVENOUS
  Administered 2018-12-17: 250 ug via INTRAVENOUS
  Administered 2018-12-17 (×2): 100 ug via INTRAVENOUS

## 2018-12-17 MED ORDER — SODIUM CHLORIDE 0.9% FLUSH: 3.0000 mL | INTRAVENOUS | Status: DC | PRN

## 2018-12-17 MED ORDER — MORPHINE SULFATE (PF) 2 MG/ML IV SOLN
1.0000 mg | INTRAVENOUS | Status: DC | PRN
  Administered 2018-12-17 – 2018-12-19 (×7): 2 mg via INTRAVENOUS
  Filled 2018-12-17: qty 1
  Filled 2018-12-17: qty 2
  Filled 2018-12-17 (×6): qty 1

## 2018-12-17 MED ORDER — VANCOMYCIN HCL IN DEXTROSE 1-5 GM/200ML-% IV SOLN
1000.0000 mg | Freq: Once | INTRAVENOUS | Status: AC
  Administered 2018-12-17: 1000 mg via INTRAVENOUS
  Filled 2018-12-17: qty 200

## 2018-12-17 MED ORDER — SODIUM CHLORIDE 0.9% FLUSH
3.0000 mL | Freq: Two times a day (BID) | INTRAVENOUS | Status: DC
  Administered 2018-12-18 – 2018-12-19 (×3): 3 mL via INTRAVENOUS
  Administered 2018-12-19: 10 mL via INTRAVENOUS
  Administered 2018-12-20: 3 mL via INTRAVENOUS

## 2018-12-17 MED ORDER — GLYCOPYRROLATE 0.2 MG/ML IJ SOLN
INTRAMUSCULAR | Status: DC | PRN
  Administered 2018-12-17: 0.2 mg via INTRAVENOUS

## 2018-12-17 MED ORDER — VANCOMYCIN HCL 1000 MG IV SOLR
INTRAVENOUS | Status: DC | PRN
  Administered 2018-12-17: 1000 mL

## 2018-12-17 MED ORDER — ROCURONIUM BROMIDE 10 MG/ML (PF) SYRINGE
PREFILLED_SYRINGE | INTRAVENOUS | Status: DC | PRN
  Administered 2018-12-17 (×4): 50 mg via INTRAVENOUS

## 2018-12-17 MED ORDER — BISACODYL 5 MG PO TBEC
10.0000 mg | DELAYED_RELEASE_TABLET | Freq: Every day | ORAL | Status: DC
  Administered 2018-12-18 – 2018-12-20 (×3): 10 mg via ORAL
  Filled 2018-12-17 (×3): qty 2

## 2018-12-17 MED ORDER — ACETAMINOPHEN 160 MG/5ML PO SOLN: 650.0000 mg | Freq: Once | ORAL | Status: AC

## 2018-12-17 MED ORDER — OXYCODONE HCL 5 MG PO TABS
5.0000 mg | ORAL_TABLET | ORAL | Status: DC | PRN
  Administered 2018-12-17: 5 mg via ORAL
  Administered 2018-12-18 (×3): 10 mg via ORAL
  Administered 2018-12-18: 5 mg via ORAL
  Administered 2018-12-19 (×4): 10 mg via ORAL
  Administered 2018-12-19 – 2018-12-20 (×3): 5 mg via ORAL
  Filled 2018-12-17: qty 1
  Filled 2018-12-17 (×4): qty 2
  Filled 2018-12-17 (×2): qty 1
  Filled 2018-12-17 (×3): qty 2
  Filled 2018-12-17: qty 1
  Filled 2018-12-17: qty 2
  Filled 2018-12-17: qty 1
  Filled 2018-12-17: qty 2

## 2018-12-17 MED ORDER — PROTAMINE SULFATE 10 MG/ML IV SOLN
INTRAVENOUS | Status: DC | PRN
  Administered 2018-12-17: 230 mg via INTRAVENOUS
  Administered 2018-12-17: 10 mg via INTRAVENOUS

## 2018-12-17 MED ORDER — POTASSIUM CHLORIDE 10 MEQ/50ML IV SOLN: 10.0000 meq | INTRAVENOUS | Status: AC

## 2018-12-17 MED ORDER — CHLORHEXIDINE GLUCONATE CLOTH 2 % EX PADS
6.0000 | MEDICATED_PAD | Freq: Every day | CUTANEOUS | Status: DC
  Administered 2018-12-17 – 2018-12-20 (×4): 6 via TOPICAL

## 2018-12-17 MED ORDER — TRAMADOL HCL 50 MG PO TABS
50.0000 mg | ORAL_TABLET | ORAL | Status: DC | PRN
  Administered 2018-12-17: 50 mg via ORAL
  Administered 2018-12-18: 100 mg via ORAL
  Filled 2018-12-17 (×2): qty 2

## 2018-12-17 MED ORDER — ACETAMINOPHEN 500 MG PO TABS
1000.0000 mg | ORAL_TABLET | Freq: Four times a day (QID) | ORAL | Status: DC
  Administered 2018-12-17 – 2018-12-20 (×9): 1000 mg via ORAL
  Filled 2018-12-17 (×10): qty 2

## 2018-12-17 SURGICAL SUPPLY — 130 items
ADAPTER CARDIO PERF ANTE/RETRO (ADAPTER) ×3 IMPLANT
APPLIER CLIP 9.375 SM OPEN (CLIP) ×3
BAG DECANTER FOR FLEXI CONT (MISCELLANEOUS) ×6 IMPLANT
BASKET HEART (ORDER IN 25'S) (MISCELLANEOUS) ×1
BASKET HEART (ORDER IN 25S) (MISCELLANEOUS) ×2 IMPLANT
BLADE CLIPPER SURG (BLADE) ×3 IMPLANT
BLADE MINI RND TIP GREEN BEAV (BLADE) ×3 IMPLANT
BLADE STERNUM SYSTEM 6 (BLADE) ×3 IMPLANT
BLADE SURG 11 STRL SS (BLADE) ×6 IMPLANT
BNDG ELASTIC 4X5.8 VLCR STR LF (GAUZE/BANDAGES/DRESSINGS) ×3 IMPLANT
BNDG ELASTIC 6X5.8 VLCR STR LF (GAUZE/BANDAGES/DRESSINGS) ×3 IMPLANT
BNDG GAUZE ELAST 4 BULKY (GAUZE/BANDAGES/DRESSINGS) ×3 IMPLANT
CANISTER SUCT 3000ML PPV (MISCELLANEOUS) ×3 IMPLANT
CANNULA EZ GLIDE AORTIC 21FR (CANNULA) ×3 IMPLANT
CANNULA GUNDRY RCSP 15FR (MISCELLANEOUS) ×3 IMPLANT
CATH CPB KIT OWEN (MISCELLANEOUS) ×3 IMPLANT
CATH HEART VENT LEFT (CATHETERS) IMPLANT
CATH THORACIC 36FR (CATHETERS) ×3 IMPLANT
CATH THORACIC 36FR RT ANG (CATHETERS) IMPLANT
CLIP APPLIE 9.375 SM OPEN (CLIP) ×2 IMPLANT
CLIP RETRACTION 3.0MM CORONARY (MISCELLANEOUS) ×3 IMPLANT
CLIP VESOCCLUDE MED 24/CT (CLIP) IMPLANT
CLIP VESOCCLUDE SM WIDE 24/CT (CLIP) IMPLANT
CONN ST 1/4X3/8  BEN (MISCELLANEOUS) ×1
CONN ST 1/4X3/8 BEN (MISCELLANEOUS) ×2 IMPLANT
COVER SURGICAL LIGHT HANDLE (MISCELLANEOUS) IMPLANT
COVER WAND RF STERILE (DRAPES) IMPLANT
DERMABOND ADVANCED (GAUZE/BANDAGES/DRESSINGS) ×1
DERMABOND ADVANCED .7 DNX12 (GAUZE/BANDAGES/DRESSINGS) ×2 IMPLANT
DRAIN CHANNEL 32F RND 10.7 FF (WOUND CARE) ×6 IMPLANT
DRAPE CARDIOVASCULAR INCISE (DRAPES) ×1
DRAPE CV SPLIT W-CLR ANES SCRN (DRAPES) IMPLANT
DRAPE INCISE IOBAN 66X45 STRL (DRAPES) IMPLANT
DRAPE PERI GROIN 82X75IN TIB (DRAPES) IMPLANT
DRAPE SLUSH/WARMER DISC (DRAPES) ×3 IMPLANT
DRAPE SRG 135X102X78XABS (DRAPES) ×2 IMPLANT
DRSG AQUACEL AG ADV 3.5X14 (GAUZE/BANDAGES/DRESSINGS) ×3 IMPLANT
DRSG COVADERM 4X14 (GAUZE/BANDAGES/DRESSINGS) IMPLANT
ELECT BLADE 4.0 EZ CLEAN MEGAD (MISCELLANEOUS) ×3
ELECT REM PT RETURN 9FT ADLT (ELECTROSURGICAL) ×6
ELECTRODE BLDE 4.0 EZ CLN MEGD (MISCELLANEOUS) ×2 IMPLANT
ELECTRODE REM PT RTRN 9FT ADLT (ELECTROSURGICAL) ×4 IMPLANT
FELT TEFLON 1X6 (MISCELLANEOUS) ×3 IMPLANT
GAUZE SPONGE 4X4 12PLY STRL (GAUZE/BANDAGES/DRESSINGS) ×6 IMPLANT
GLOVE BIO SURGEON STRL SZ 6 (GLOVE) IMPLANT
GLOVE BIO SURGEON STRL SZ 6.5 (GLOVE) ×18 IMPLANT
GLOVE BIO SURGEON STRL SZ7 (GLOVE) IMPLANT
GLOVE BIO SURGEON STRL SZ7.5 (GLOVE) IMPLANT
GLOVE ORTHO TXT STRL SZ7.5 (GLOVE) ×9 IMPLANT
GOWN STRL REUS W/ TWL LRG LVL3 (GOWN DISPOSABLE) ×16 IMPLANT
GOWN STRL REUS W/TWL LRG LVL3 (GOWN DISPOSABLE) ×8
HEMOSTAT POWDER SURGIFOAM 1G (HEMOSTASIS) ×9 IMPLANT
HEMOSTAT SURGICEL 2X14 (HEMOSTASIS) ×3 IMPLANT
INSERT FOGARTY XLG (MISCELLANEOUS) ×3 IMPLANT
KIT BASIN OR (CUSTOM PROCEDURE TRAY) ×3 IMPLANT
KIT SUCTION CATH 14FR (SUCTIONS) ×12 IMPLANT
KIT TURNOVER KIT B (KITS) ×3 IMPLANT
KIT VASOVIEW HEMOPRO 2 VH 4000 (KITS) ×3 IMPLANT
LEAD PACING MYOCARDI (MISCELLANEOUS) ×3 IMPLANT
MARKER GRAFT CORONARY BYPASS (MISCELLANEOUS) ×9 IMPLANT
NS IRRIG 1000ML POUR BTL (IV SOLUTION) ×15 IMPLANT
PACK E OPEN HEART (SUTURE) ×3 IMPLANT
PACK OPEN HEART (CUSTOM PROCEDURE TRAY) ×3 IMPLANT
PAD ARMBOARD 7.5X6 YLW CONV (MISCELLANEOUS) ×6 IMPLANT
PAD ELECT DEFIB RADIOL ZOLL (MISCELLANEOUS) ×3 IMPLANT
PENCIL BUTTON HOLSTER BLD 10FT (ELECTRODE) ×3 IMPLANT
POSITIONER HEAD DONUT 9IN (MISCELLANEOUS) ×3 IMPLANT
PUNCH AORTIC ROTATE  4.5MM 8IN (MISCELLANEOUS) ×3 IMPLANT
PUNCH AORTIC ROTATE 4.0MM (MISCELLANEOUS) IMPLANT
PUNCH AORTIC ROTATE 4.5MM 8IN (MISCELLANEOUS) IMPLANT
PUNCH AORTIC ROTATE 5MM 8IN (MISCELLANEOUS) IMPLANT
SET CARDIOPLEGIA MPS 5001102 (MISCELLANEOUS) ×3 IMPLANT
SET IRRIG TUBING LAPAROSCOPIC (IRRIGATION / IRRIGATOR) IMPLANT
SOL ANTI FOG 6CC (MISCELLANEOUS) ×2 IMPLANT
SOLUTION ANTI FOG 6CC (MISCELLANEOUS) ×1
SPONGE INTESTINAL PEANUT (DISPOSABLE) ×3 IMPLANT
SPONGE LAP 18X18 RF (DISPOSABLE) IMPLANT
SPONGE LAP 4X18 RFD (DISPOSABLE) IMPLANT
SUCTION CARDIAC 10FR ×3 IMPLANT
SUT BONE WAX W31G (SUTURE) ×3 IMPLANT
SUT ETHIBON 2 0 V 52N 30 (SUTURE) IMPLANT
SUT ETHIBON EXCEL 2-0 V-5 (SUTURE) IMPLANT
SUT ETHIBOND 2 0 SH (SUTURE)
SUT ETHIBOND 2 0 SH 36X2 (SUTURE) IMPLANT
SUT ETHIBOND 2 0 V4 (SUTURE) IMPLANT
SUT ETHIBOND 2 0V4 GREEN (SUTURE) IMPLANT
SUT ETHIBOND 4 0 RB 1 (SUTURE) IMPLANT
SUT ETHIBOND V-5 VALVE (SUTURE) IMPLANT
SUT ETHIBOND X763 2 0 SH 1 (SUTURE) ×9 IMPLANT
SUT MNCRL AB 3-0 PS2 18 (SUTURE) ×6 IMPLANT
SUT MNCRL AB 4-0 PS2 18 (SUTURE) ×3 IMPLANT
SUT PDS AB 1 CTX 36 (SUTURE) ×6 IMPLANT
SUT PROLENE 2 0 SH DA (SUTURE) IMPLANT
SUT PROLENE 3 0 SH DA (SUTURE) ×3 IMPLANT
SUT PROLENE 3 0 SH1 36 (SUTURE) IMPLANT
SUT PROLENE 4 0 RB 1 (SUTURE) ×1
SUT PROLENE 4 0 SH DA (SUTURE) ×12 IMPLANT
SUT PROLENE 4-0 RB1 .5 CRCL 36 (SUTURE) ×2 IMPLANT
SUT PROLENE 5 0 C 1 36 (SUTURE) IMPLANT
SUT PROLENE 6 0 C 1 30 (SUTURE) ×3 IMPLANT
SUT PROLENE 7.0 RB 3 (SUTURE) ×18 IMPLANT
SUT PROLENE 8 0 BV175 6 (SUTURE) ×3 IMPLANT
SUT PROLENE BLUE 7 0 (SUTURE) ×6 IMPLANT
SUT PROLENE POLY MONO (SUTURE) IMPLANT
SUT SILK  1 MH (SUTURE) ×2
SUT SILK 1 MH (SUTURE) ×4 IMPLANT
SUT SILK 2 0 SH CR/8 (SUTURE) IMPLANT
SUT SILK 3 0 SH CR/8 (SUTURE) IMPLANT
SUT STEEL 6MS V (SUTURE) ×3 IMPLANT
SUT STEEL STERNAL CCS#1 18IN (SUTURE) IMPLANT
SUT STEEL SZ 6 DBL 3X14 BALL (SUTURE) ×6 IMPLANT
SUT VIC AB 1 CTX 36 (SUTURE) ×1
SUT VIC AB 1 CTX36XBRD ANBCTR (SUTURE) ×2 IMPLANT
SUT VIC AB 2-0 CT1 27 (SUTURE) ×1
SUT VIC AB 2-0 CT1 TAPERPNT 27 (SUTURE) ×2 IMPLANT
SUT VIC AB 2-0 CTX 27 (SUTURE) IMPLANT
SUT VIC AB 3-0 SH 27 (SUTURE)
SUT VIC AB 3-0 SH 27X BRD (SUTURE) IMPLANT
SUT VIC AB 3-0 X1 27 (SUTURE) IMPLANT
SUT VICRYL 4-0 PS2 18IN ABS (SUTURE) IMPLANT
SYSTEM SAHARA CHEST DRAIN ATS (WOUND CARE) ×3 IMPLANT
TAPES RETRACTO (MISCELLANEOUS) ×3 IMPLANT
TOWEL GREEN STERILE (TOWEL DISPOSABLE) ×3 IMPLANT
TOWEL GREEN STERILE FF (TOWEL DISPOSABLE) ×3 IMPLANT
TRAY FOLEY SLVR 16FR TEMP STAT (SET/KITS/TRAYS/PACK) ×3 IMPLANT
TUBE SUCTION CARDIAC 10FR (CANNULA) IMPLANT
TUBING LAP HI FLOW INSUFFLATIO (TUBING) ×3 IMPLANT
UNDERPAD 30X30 (UNDERPADS AND DIAPERS) ×3 IMPLANT
VENT LEFT HEART 12002 (CATHETERS)
WATER STERILE IRR 1000ML POUR (IV SOLUTION) ×6 IMPLANT

## 2018-12-17 NOTE — Op Note (Signed)
CARDIOTHORACIC SURGERY OPERATIVE NOTE  Date of Procedure: 12/17/2018  Preoperative Diagnosis: Severe 3-vessel Coronary Artery Disease  Postoperative Diagnosis: Same  Procedure:    Coronary Artery Bypass Grafting x 3  Left Internal Mammary Artery to Distal Left Anterior Descending Coronary Artery  Saphenous Vein Graft to Posterior Descending Coronary Artery  Saphenous Vein Graft to Second Obtuse Marginal Branch of Left Circumflex Coronary Artery  Endoscopic Vein Harvest from Right Thigh and Lower Leg  Surgeon: Valentina Gu. Roxy Manns, MD  Assistant: Ellwood Handler, PA-C  Anesthesia: Albertha Ghee, MD  Operative Findings:  Normal left ventricular systolic function  Mild central aortic insufficiency  Good quality left internal mammary artery conduit  Good quality saphenous vein conduit  Good quality target vessels for grafting    BRIEF CLINICAL NOTE AND INDICATIONS FOR SURGERY  Patient is a 63 year old female with former history of bipolar disorder and morbid obesity, hypertension, type 2 diabetes mellitus, hypothyroidism and longstanding tobacco abuse who more recently was found that her underlying psychiatric illness had been misdiagnosed and subsequently lost a tremendous amount of weight.  She reportedly has been eating a much more healthy diet and she has become much more functional.  Her diabetes has become much more easy to control.  However, over the past several months the patient has developed accelerating symptoms of substernal chest pain consistent with unstable angina pectoris.  Symptoms are relieved by sublingual nitroglycerin.  She recently underwent stress Myoview study which was felt to be abnormal and high risk.  Transthoracic echocardiogram revealed normal left ventricular systolic function with moderate aortic insufficiency.  The patient has been seen in consultation and counseled at length regarding the indications, risks and potential benefits of surgery.  All  questions have been answered, and the patient provides full informed consent for the operation as described.    DETAILS OF THE OPERATIVE PROCEDURE  Preparation:  The patient is brought to the operating room on the above mentioned date and central monitoring was established by the anesthesia team including placement of Swan-Ganz catheter and radial arterial line. The patient is placed in the supine position on the operating table.  Intravenous antibiotics are administered. General endotracheal anesthesia is induced uneventfully. A Foley catheter is placed.  Baseline transesophageal echocardiogram was performed.  Findings were notable for normal left ventricular size and systolic function.  There was mild (1+) central aortic insufficiency.  The aortic valve is trileaflet.  There was mild calcification and thickening of the noncoronary leaflet.  The other 2 leaflets looked fairly normal.  There was no valve leaflet prolapse.  The patient's chest, abdomen, both groins, and both lower extremities are prepared and draped in a sterile manner. A time out procedure is performed.   Surgical Approach and Conduit Harvest:  A median sternotomy incision was performed and the left internal mammary artery is dissected from the chest wall and prepared for bypass grafting. The left internal mammary artery is notably good quality conduit. Simultaneously, the greater saphenous vein is obtained from the patient's right thigh and leg using endoscopic vein harvest technique. The saphenous vein is notably good quality conduit. After removal of the saphenous vein, the small surgical incisions in the lower extremity are closed with absorbable suture. Following systemic heparinization, the left internal mammary artery was transected distally noted to have excellent flow.   Extracorporeal Cardiopulmonary Bypass and Myocardial Protection:  The pericardium is opened. The ascending aorta is normal in appearance. The  ascending aorta and the right atrium are cannulated for cardiopulmonary bypass.  Adequate heparinization is verified.    A retrograde cardioplegia cannula is placed through the right atrium into the coronary sinus.  The entire pre-bypass portion of the operation was notable for stable hemodynamics.  Cardiopulmonary bypass was begun and the surface of the heart is inspected. Distal target vessels are selected for coronary artery bypass grafting. A cardioplegia cannula is placed in the ascending aorta.  A temperature probe was placed in the interventricular septum.  The patient is allowed to cool passively to [facility] systemic temperature.  The aortic cross clamp is applied and cold blood cardioplegia is delivered initially in an antegrade fashion through the aortic root.   Supplemental cardioplegia is given retrograde through the coronary sinus catheter.  Iced saline slush is applied for topical hypothermia.  The initial cardioplegic arrest is rapid with early diastolic arrest.  Repeat doses of cardioplegia are administered intermittently throughout the entire cross clamp portion of the operation through the aortic root,  through the coronary sinus catheter, and through subsequently placed vein grafts in order to maintain completely flat electrocardiogram and septal myocardial temperature below 15C.  Myocardial protection was felt to be excellent.   Coronary Artery Bypass Grafting:   The posterior descending branch of the right coronary artery was grafted using a reversed saphenous vein graft in an end-to-side fashion.  At the site of distal anastomosis the target vessel was good quality and measured approximately 1.5 mm in diameter.  The second obtuse marginal branch of the left circumflex coronary artery was grafted using a reversed saphenous vein graft in an end-to-side fashion.  At the site of distal anastomosis the target vessel was intramyocardial but good quality and measured approximately 2.0 mm in  diameter.  The distal left anterior coronary artery was grafted with the left internal mammary artery in an end-to-side fashion.  At the site of distal anastomosis the target vessel was deeply intramyocardial.  Locating the vessel was challenging but ultimately an appropriate site for grafting was identified.  At this site the vessel was good quality and measured approximately 2.0 mm in diameter.  All proximal vein graft anastomoses were placed directly to the ascending aorta prior to removal of the aortic cross clamp.  The septal myocardial temperature rose rapidly after reperfusion of the left internal mammary artery graft.  The aortic cross clamp was removed after a total cross clamp time of 93 minutes.   Procedure Completion:  All proximal and distal coronary anastomoses were inspected for hemostasis and appropriate graft orientation. Epicardial pacing wires are fixed to the right ventricular outflow tract and to the right atrial appendage. The patient is rewarmed to 37C temperature. The patient is weaned and disconnected from cardiopulmonary bypass.  The patient's rhythm at separation from bypass was sinus.  The patient was weaned from cardiopulmonary bypass without any inotropic support. Total cardiopulmonary bypass time for the operation was 111 minutes.  Followup transesophageal echocardiogram performed after separation from bypass revealed no changes from the preoperative exam.  The aortic and venous cannula were removed uneventfully. Protamine was administered to reverse the anticoagulation. The mediastinum and pleural space were inspected for hemostasis and irrigated with saline solution. The mediastinum and the left pleural space were drained using 3 chest tubes placed through separate stab incisions inferiorly.  The soft tissues anterior to the aorta were reapproximated loosely. The sternum is closed with double strength sternal wire. The soft tissues anterior to the sternum were closed in  multiple layers and the skin is closed with a running subcuticular skin  closure.  The post-bypass portion of the operation was notable for stable rhythm and hemodynamics.  No blood products were administered during the operation.   Disposition:  The patient tolerated the procedure well and is transported to the surgical intensive care in stable condition. There are no intraoperative complications. All sponge instrument and needle counts are verified correct at completion of the operation.    Salvatore Decentlarence H. Cornelius Moraswen MD 12/17/2018 12:57 PM

## 2018-12-17 NOTE — Anesthesia Preprocedure Evaluation (Signed)
Anesthesia Evaluation  Patient identified by MRN, date of birth, ID band Patient awake    Reviewed: Allergy & Precautions, H&P , NPO status , Patient's Chart, lab work & pertinent test results  Airway Mallampati: II   Neck ROM: full    Dental   Pulmonary Current Smoker,    breath sounds clear to auscultation       Cardiovascular hypertension, + CAD  + Valvular Problems/Murmurs AI  Rhythm:regular Rate:Normal     Neuro/Psych    GI/Hepatic PUD,   Endo/Other  diabetes, Type 2Hypothyroidism   Renal/GU      Musculoskeletal   Abdominal   Peds  Hematology   Anesthesia Other Findings   Reproductive/Obstetrics                             Anesthesia Physical Anesthesia Plan  ASA: III  Anesthesia Plan: General   Post-op Pain Management:    Induction: Intravenous  PONV Risk Score and Plan: 2 and Ondansetron, Treatment may vary due to age or medical condition, Dexamethasone and Midazolam  Airway Management Planned: Oral ETT  Additional Equipment: Arterial line, CVP, TEE, 3D TEE, Ultrasound Guidance Line Placement and PA Cath  Intra-op Plan:   Post-operative Plan:   Informed Consent: I have reviewed the patients History and Physical, chart, labs and discussed the procedure including the risks, benefits and alternatives for the proposed anesthesia with the patient or authorized representative who has indicated his/her understanding and acceptance.       Plan Discussed with: CRNA, Anesthesiologist and Surgeon  Anesthesia Plan Comments:         Anesthesia Quick Evaluation

## 2018-12-17 NOTE — Progress Notes (Signed)
Rapid weaning protocol 

## 2018-12-17 NOTE — OR Nursing (Signed)
Twenty minute call to 2 Heart at 1235. Spoke to [place]. Cath Lab also notified of timing. Spoke to [place].

## 2018-12-17 NOTE — OR Nursing (Signed)
Forty-five minute call to 2 Heart Charge nurse at 1208. Spoke to [place].

## 2018-12-17 NOTE — Anesthesia Procedure Notes (Signed)
Central Venous Catheter Insertion Performed by: Suzette Battiest, MD, anesthesiologist Start/End9/01/2019 6:45 AM, 12/17/2018 7:05 AM Patient location: Pre-op. Preanesthetic checklist: patient identified, IV checked, site marked, risks and benefits discussed, surgical consent, monitors and equipment checked, pre-op evaluation, timeout performed and anesthesia consent Hand hygiene performed  and maximum sterile barriers used  PA cath was placed.Swan type:thermodilution PA Cath depth:50 Procedure performed without using ultrasound guided technique. Attempts: 1 Patient tolerated the procedure well with no immediate complications.

## 2018-12-17 NOTE — Transfer of Care (Signed)
Immediate Anesthesia Transfer of Care Note  Patient: Christine Skinner  Procedure(s) Performed: CORONARY ARTERY BYPASS GRAFTING (CABG), ON PUMP, TIMES THREE, USING LEFT INTERNAL MAMMARY ARTERY AND ENDOSCOPICALLY HARVESTED RIGHT GREATER SAPHENOUS VEIN (N/A Chest) TRANSESOPHAGEAL ECHOCARDIOGRAM (TEE) (N/A )  Patient Location: SICU  Anesthesia Type:General  Level of Consciousness: Patient remains intubated per anesthesia plan  Airway & Oxygen Therapy: Patient remains intubated per anesthesia plan  Post-op Assessment: Report given to RN and Post -op Vital signs reviewed and stable  Post vital signs: Reviewed and stable  Last Vitals:  Vitals Value Taken Time  BP 100/71 12/17/18 1313  Temp 35.9 C 12/17/18 1322  Pulse 80 12/17/18 1322  Resp 0 12/17/18 1322  SpO2 100 % 12/17/18 1322  Vitals shown include unvalidated device data.  Last Pain:  Vitals:   12/17/18 0508  TempSrc: Oral  PainSc:          Complications: No apparent anesthesia complications

## 2018-12-17 NOTE — Progress Notes (Signed)
      [address]       [place], [address]             [phone]     CARDIOTHORACIC SURGERY PROGRESS NOTE  Subjective: Christine Skinner has been scheduled for CORONARY ARTERY BYPASS GRAFTING WITH POSSIBLE AORTIC VALVE REPAIR OR REPLACEMENT today.   Objective: Vital signs in last 24 hours: Temp:  [97 F (36.1 C)-98.6 F (37 C)] 97.7 F (36.5 C) (09/10 0508) Pulse Rate:  [56-71] 63 (09/10 0508) Cardiac Rhythm: Sinus bradycardia (09/09 1910) Resp:  [20] 20 (09/10 0508) BP: (126-161)/(73-83) 126/80 (09/10 0508) SpO2:  [99 %-100 %] 100 % (09/10 0508)  Physical Exam: Unchanged from previously   Intake/Output from previous day: 09/09 0701 - 09/10 0700 In: 840 [P.O.:840] Out: 2500 [Urine:2500] Intake/Output this shift: Total I/O In: 0  Out: 300 [Urine:300]  Lab Results: No results for input(s): WBC, HGB, HCT, PLT in the last 72 hours. BMET:  Recent Labs    12/16/18 0839 12/16/18 1844  NA 138 139  K 4.0 4.2  CL 107 109  CO2 23 23  GLUCOSE 123* 178*  BUN 10 9  CREATININE 0.73 0.84  CALCIUM 9.1 9.0    CBG (last 3)  Recent Labs    12/16/18 1635 12/16/18 2212 12/17/18 0605  GLUCAP 115* 89 117*   PT/INR:  No results for input(s): LABPROT, INR in the last 72 hours.  Assessment/Plan:   The various methods of treatment have been discussed with the patient. After consideration of the risks, benefits and treatment options the patient has consented to the planned procedure.   The patient has been seen and labs reviewed. There are no changes in the patient's condition to prevent proceeding with the planned procedure today.   Rexene Alberts, MD 12/17/2018 6:09 AM

## 2018-12-17 NOTE — Progress Notes (Signed)
Accompanied pt to OR, gave report to the OR nurse, Lopressor( no parameters indicated on the orders) not given as pt been SB on my shift. EKG was obtained.    Addressed to OR team that PT wants Christine Skinner Her sister be called and be updated about her. Name and number was put on pT chart. Report given to them.

## 2018-12-17 NOTE — Research (Addendum)
[facility] Research Study   Post surgery urine collected for Nephro Check #1 sample. Result <0.04, will collect again.

## 2018-12-17 NOTE — Research (Signed)
[facility] Research Study Post surgery Nephro Check #2 collected @1615 . Result <0.04. Patient will be in the observational arm of the study.

## 2018-12-17 NOTE — Discharge Summary (Addendum)
Physician Discharge Summary  Patient ID: Christine Skinner MRN: [id number] DOB/AGE: 61/11/1955 61 y.o.  Admit date: 12/15/2018 Discharge date: 12/23/2018  Admission Diagnoses:  Patient Active Problem List   Diagnosis Date Noted  . Abnormal stress test 12/15/2018  . Unstable angina (HCC) 12/15/2018  . Angina pectoris (HCC) 11/30/2018  . Moderate aortic regurgitation 11/30/2018  . Essential hypertension 11/30/2018  . Hypercholesterolemia 11/30/2018   Discharge Diagnoses:   Patient Active Problem List   Diagnosis Date Noted  . S/P CABG x 3 12/17/2018  . Atrial fibrillation (HCC) 12/17/2018  . Abnormal stress test 12/15/2018  . Unstable angina (HCC) 12/15/2018  . Angina pectoris (HCC) 11/30/2018  . Moderate aortic regurgitation 11/30/2018  . Essential hypertension 11/30/2018  . Hypercholesterolemia 11/30/2018   Discharged Condition: good  History of Present Illness:   [name] is a 63 yo female with former history of bipolar disorder and morbid obesity, hypertension, type 2 diabetes mellitus, hypothyroidism and longstanding tobacco abuse who more recently was found that her underlying psychiatric illness had been misdiagnosed and subsequently lost a tremendous amount of weight.  She reportedly has been eating a much more healthy diet and she has become much more functional.  Her diabetes has become much more easy to control.  However, over the past several months the patient has developed accelerating symptoms of substernal chest pain consistent with unstable angina pectoris.  Symptoms are relieved by sublingual nitroglycerin.  She recently underwent stress Myoview study which was felt to be abnormal and high risk.  Transthoracic echocardiogram revealed normal left ventricular systolic function with moderate aortic insufficiency.  [facility] on 12/15/2018 which revealed 3 vessel CAD with LM involvement.  It was felt coronary  bypass grafting with possible Aortic Valve replacement would be indicated.  She was admitted and TCTS consult was obtained.  Hospital Course:   The patient was chest pain free during admission.  She was evaluated by Dr. Cornelius Moraswen who felt coronary bypass grafting would be indicated.  He would re-evaluate on valve in the operating room with TEE and if indicated would replace or repair her aortic valve if needed.  The risks and benefits of the procedure were explained to the patient and she was agreeable to proceed.  She was taken to the operating room on 12/17/2018.  TEE was performed and revealed her to have Mild Aortic Regurgitation which would not require intervention.  She underwent CABG x 3 utilizing LIMA to LAD, SVG to OM 2, and SVG to PDA.  She also underwent endoscopic harvest of greater saphenous vein from her right leg.  She tolerated the procedure without difficulty and was taken to the SICU in stable condition.    The patient was extubated early evening of surgery without difficulty. She remained afebrile and hemodynamically stable. She was weaned off Neo Synephrine drip. Theone MurdochSwan Ganz, a line, chest tubes, and foley were removed early in the post operative course. Lopressor was started and titrated accordingly. She was volume over loaded and diuresed. She had ABL anemia. She did require a post op transfusion. Last H and H was 8.7. She was weaned off the insulin drip.  The patient's HGA1C pre op was 5.6  The patient was felt surgically stable for transfer from the ICU to PCTU for further convalescence on 12/20/2018 .She continues to progress with cardiac rehab. She was ambulating on room air. She has been tolerating a diet and has had a bowel movement. Epicardial pacing wires  were removed on 12/21/2018.  Chest tube sutures will be removed the day of discharge. The patient is felt surgically stable for discharge today.  Significant Diagnostic Studies: angiography:  LM: Distal LM birfucation 95% severely  calcified stenosis. LAD: Ostial/prox LAD 95% severely calcified stenosis.         Distal apical LAD diffuse 90% stenosis         Occluded Diag 1 LCx:  Ostial LCx 95% severely calcified stenosis.         OM2 prox 40% stenosis RCA: Prox 40% calcific stenosis.         Mid 80% moderately calcified stenosis.         Prox 40% PDA stenosis.  LVEDP mildly elevated 19 mmHg.   Treatments: surgery:   CORONARY ARTERY BYPASS GRAFTING x 3 -LIMA to LAD -SVG to OM -SVG to PDA  ENDOSCOPIC HARVEST GREATER SAPHENOUS VEIN  -Right Leg  TRANSESOPHAGEAL ECHOCARDIOGRAM (TEE) (N/A)  Discharge Exam: Blood pressure 136/75, pulse 72, temperature 98.4 F (36.9 C), temperature source Oral, resp. rate 18, height 5\' 6"  (1.676 m), weight 82.2 kg, SpO2 94 %.  General appearance: alert, cooperative and no distress Heart: regular rate and rhythm Lungs: clear to auscultation bilaterally Abdomen: soft, non-tender; bowel sounds normal; no masses,  no organomegaly Extremities: edema trace Wound: clean and dry  Discharge Medications:   Allergies as of 12/23/2018   No Known Allergies     Medication List    TAKE these medications   acetaminophen 325 MG tablet Commonly known as: TYLENOL Take 2 tablets (650 mg total) by mouth every 6 (six) hours as needed for mild pain or fever.   aspirin EC 81 MG tablet Take 81 mg by mouth daily.   clopidogrel 75 MG tablet Commonly known as: PLAVIX Take 1 tablet (75 mg total) by mouth daily.   dextromethorphan-guaiFENesin 30-600 MG 12hr tablet Commonly known as: MUCINEX DM Take 1 tablet by mouth 2 (two) times daily as needed for cough.   fluticasone 50 MCG/ACT nasal spray Commonly known as: FLONASE Place 1 spray into both nostrils daily as needed for allergies.   furosemide 40 MG tablet Commonly known as: LASIX Take 1 tablet (40 mg total) by mouth daily. For 5 days   levothyroxine 100 MCG tablet Commonly known as: SYNTHROID Take 100 mcg by mouth daily  before breakfast.   Lipitor 40 MG tablet Generic drug: atorvastatin Take 40 mg by mouth daily.   lisinopril 5 MG tablet Commonly known as: ZESTRIL Take 1 tablet (5 mg total) by mouth daily. What changed:   medication strength  how much to take   metFORMIN 500 MG tablet Commonly known as: GLUCOPHAGE Take 500 mg by mouth daily with breakfast.   metoprolol succinate 25 MG 24 hr tablet Commonly known as: TOPROL-XL Take 1 tablet (25 mg total) by mouth daily. Take with or immediately following a meal.   nitroGLYCERIN 0.4 MG SL tablet Commonly known as: NITROSTAT Place 0.4 mg under the tongue every 5 (five) minutes as needed for chest pain.   omeprazole 20 MG capsule Commonly known as: PRILOSEC Take 20 mg by mouth daily.   oxyCODONE 5 MG immediate release tablet Commonly known as: Oxy IR/ROXICODONE Take 1-2 tablets (5-10 mg total) by mouth every 4 (four) hours as needed for severe pain.   potassium chloride SA 20 MEQ tablet Commonly known as: K-DUR Take 1 tablet (20 mEq total) by mouth daily. For 5 days      Follow-up Information  Miquel Dunn, NP. Go on 12/25/2018.   Specialty: Cardiology Why: Appointment time is at 11:00 am Contact information: [place]  [phone]        [facility] [place]. Go on 01/11/2019.   Specialty: Cardiothoracic Surgery Why: PA/LAT CXR to be taken (at [place] which is in the same building as Dr. Guy Sandifer office) on 10/05 at 1:30 pm;Appointment time is at 2:00 pm Contact information: [place], [place] [phone]         The patient has been discharged on:   1.Beta Blocker:  Yes [  x ]                              No   [   ]                              If No, reason:  2.Ace Inhibitor/ARB: Yes [  x ]                                     No  [    ]                                     If  No, reason:  3.Statin:   Yes [  x ]                  No  [   ]                  If No, reason:  4.Ecasa:  Yes  [x   ]                  No   [   ]                  If No, reason: Signed: Jennae Hakeem PA-C 12/23/2018, 7:50 AM

## 2018-12-17 NOTE — Brief Op Note (Signed)
12/15/2018 - 12/17/2018  12:56 PM  PATIENT:  Christine Skinner  63 y.o. female  PRE-OPERATIVE DIAGNOSIS:  CAD  POST-OPERATIVE DIAGNOSIS:  CAD  PROCEDURE:  Procedure(s):  CORONARY ARTERY BYPASS GRAFTING x 3 -LIMA to LAD -SVG to OM -SVG to PDA  ENDOSCOPIC HARVEST GREATER SAPHENOUS VEIN  -Right Leg  TRANSESOPHAGEAL ECHOCARDIOGRAM (TEE) (N/A)  SURGEON:  Surgeon(s) and Role:    Rexene Alberts, MD - Primary  PHYSICIAN ASSISTANT: Erin Barrett PA-C  ANESTHESIA:   general  EBL:  610 mL   BLOOD ADMINISTERED: CELLSAVER  DRAINS: Left Pleural Chest Tube, Mediastinal Chest Drains   LOCAL MEDICATIONS USED:  NONE  SPECIMEN:  No Specimen  DISPOSITION OF SPECIMEN:  N/A  Skinner:  YES  TOURNIQUET:  * No tourniquets in log *  DICTATION: .Dragon Dictation  PLAN OF CARE: Admit to inpatient   PATIENT DISPOSITION:  ICU - intubated and hemodynamically stable.   Delay start of Pharmacological VTE agent (>24hrs) due to surgical blood loss or risk of bleeding: yes

## 2018-12-17 NOTE — Progress Notes (Signed)
EVENING ROUNDS NOTE :     [address]       ,[place] [address]             [phone]                 Day of Surgery Procedure(s) (LRB): CORONARY ARTERY BYPASS GRAFTING (CABG), ON PUMP, TIMES THREE, USING LEFT INTERNAL MAMMARY ARTERY AND ENDOSCOPICALLY HARVESTED RIGHT GREATER SAPHENOUS VEIN (N/A) TRANSESOPHAGEAL ECHOCARDIOGRAM (TEE) (N/A)   Total Length of Stay:  LOS: 2 days  Events:  Extubated.  HD stable.  Minimal CT output    BP 97/63   Pulse 80   Temp 98.1 F (36.7 C)   Resp 19   Ht 5\' 6"  (1.676 m)   Wt 79.8 kg   SpO2 100%   BMI 28.41 kg/m   PAP: (16-22)/(8-15) 16/13 CO:  [3 L/min-4 L/min] 4 L/min CI:  [1.6 L/min/m2-2.1 L/min/m2] 2.1 L/min/m2  Vent Mode: SIMV;PRVC;PSV FiO2 (%):  [36 %-50 %] 36 % Set Rate:  [4 bmp-12 bmp] 4 bmp Vt Set:  [470 mL] 470 mL PEEP:  [5 cmH20] 5 cmH20 Pressure Support:  [10 cmH20] 10 cmH20 Plateau Pressure:  [14 cmH20-17 cmH20] 14 cmH20  . sodium chloride 20 mL/hr at 12/17/18 1800  . sodium chloride 100 mL/hr at 12/17/18 1800  . [START ON 12/18/2018] sodium chloride    . sodium chloride 10 mL/hr at 12/17/18 1335  . cefUROXime (ZINACEF)  IV Stopped (12/17/18 1643)  . dexmedetomidine (PRECEDEX) IV infusion 0.1 mcg/kg/hr (12/17/18 1800)  . famotidine (PEPCID) IV Stopped (12/17/18 1353)  . insulin 1.1 mL/hr at 12/17/18 1800  . lactated ringers    . lactated ringers    . lactated ringers 20 mL/hr at 12/17/18 1800  . nitroGLYCERIN 0 mcg/min (12/17/18 1333)  . phenylephrine (NEO-SYNEPHRINE) Adult infusion 15 mcg/min (12/17/18 1329)  . vancomycin      I/O last 3 completed shifts: In: 840 [P.O.:840] Out: 4100 [Urine:4100]   CBC Latest Ref Rng & Units 12/17/2018 12/17/2018 12/17/2018  WBC 4.0 - 10.5 K/uL - - -  Hemoglobin 12.0 - 15.0 g/dL 8.8(L) 8.8(L) 9.2(L)  Hematocrit 36.0 - 46.0 % 26.0(L) 26.0(L) 27.0(L)  Platelets 150 - 400 K/uL - - -    BMP Latest Ref Rng & Units 12/17/2018 12/17/2018 12/17/2018  Glucose 70 -  99 mg/dL - - -  BUN 8 - 23 mg/dL - - -  Creatinine 0.44 - 1.00 mg/dL - - -  BUN/Creat Ratio 12 - 28 - - -  Sodium 135 - 145 mmol/L 143 143 143  Potassium 3.5 - 5.1 mmol/L 4.0 4.0 4.0  Chloride 98 - 111 mmol/L - - -  CO2 22 - 32 mmol/L - - -  Calcium 8.9 - 10.3 mg/dL - - -    ABG    Component Value Date/Time   PHART 7.341 (L) 12/17/2018 1801   PCO2ART 36.7 12/17/2018 1801   PO2ART 132.0 (H) 12/17/2018 1801   HCO3 19.9 (L) 12/17/2018 1801   TCO2 21 (L) 12/17/2018 1801   ACIDBASEDEF 5.0 (H) 12/17/2018 1801   O2SAT 99.0 12/17/2018 1801       Melodie Bouillon, MD 12/17/2018 6:53 PM

## 2018-12-17 NOTE — Procedures (Signed)
Extubation Procedure Note  Patient Details:   Name: Christine Skinner DOB: [birth year] MRN: [id number]   Airway Documentation:    Vent end date: 12/17/18 Vent end time: 1700   Evaluation  O2 sats: stable throughout Complications: No apparent complications Patient did tolerate procedure well. Bilateral Breath Sounds: Clear, Diminished   Yes   Patient extubated per protocol to 4L [place] with no apparent complications. Positive cuff leak was noted prior to extubation. Patient achieved NIF of -24 and VC of .85L with good effort. Patient is alert and is able to weakly speak. Vitals are stable. RT will continue to monitor.   Areatha Kalata Clyda Greener 12/17/2018, 5:13 PM

## 2018-12-17 NOTE — Anesthesia Procedure Notes (Signed)
Procedure Name: Intubation Date/Time: 12/17/2018 8:10 AM Performed by: Clearnce Sorrel, CRNA Pre-anesthesia Checklist: Patient identified, Emergency Drugs available, Suction available, Patient being monitored and Timeout performed Patient Re-evaluated:Patient Re-evaluated prior to induction Oxygen Delivery Method: Circle system utilized Preoxygenation: Pre-oxygenation with 100% oxygen Induction Type: IV induction Ventilation: Mask ventilation without difficulty Laryngoscope Size: Mac and 3 Grade View: Grade I Tube type: Oral Tube size: 8.0 mm Number of attempts: 1 Airway Equipment and Method: Stylet Placement Confirmation: ETT inserted through vocal cords under direct vision,  positive ETCO2 and breath sounds checked- equal and bilateral Secured at: 22 cm Tube secured with: Tape Dental Injury: Teeth and Oropharynx as per pre-operative assessment

## 2018-12-17 NOTE — Progress Notes (Signed)
  Echocardiogram Echocardiogram Transesophageal has been performed.  Christine Skinner 12/17/2018, 9:01 AM

## 2018-12-17 NOTE — Research (Signed)
[facility] Visit 2 Central labs obtained pre surgery:      EQ-[id number]  MOBILITY:    I HAVE NO PROBLEMS WALKING [x]   I HAVE SLIGHT PROBLEMS WALKING []   I HAVE MODERATE PROBLEMS WALKING []   I HAVE SEVERE PROBLEMS WALKING []   I AM UNABLE TO WALK  []     SELF-CARE:   I HAVE NO PROBLEMS WASING OR DRESSING MYSELF  [x]   I HAVE SLIGHT PROBLEMS WASHING OR DRESSING MYSELF  []   I HAVE MODERATE PROBLEMS WASHING OR DRESSING MYSELF []   I HAVE SEVERE PROBLEMS WASHING OR DRESSING MYSELF  []   I HAVE SEVERE PROBLEMS WASHING OR DRESSING MYSELF  []   I AM UNABLE TO WASH OR DRESS MYSELF []     USUAL ACTIVITIES: (E.G. WORK/STUDY/HOUSEWORK/FAMILY OR LEISURE ACTIVITIES.    I HAVE NO PROBLEMS DOING MY USUAL ACTIVITIES [x]   I HAVE SLIGHT PROBLEMS DOING MY USUAL ACTIVITIES []   I HAVE MODERATE PROBLEMS DOING MY USUAL ACTIVIITIES []   I HAVE SEVERE PROBLEMS DOING MY USUAL ACTIVITIES []   I AM UNABLE TO DO MY USUAL ACTIVITIES []     PAIN /DISCOMFORT   I HAVE NO PAIN OR DISCOMFORT [x]   I HAVE SLIGHT PAIN OR DISCOMFORT []   I HAVE MODERATE PAIN OR DISCOMFORT []   I HAVE SEVERE PAIN OR DISCOMFORT []   I HAVE EXTREME PAIN OR DISCOMFORT []     ANXIETY/DEPRESSION   I AM NOT ANXIOUS OR DEPRESSED []   I AM SLIGHTLY ANXIOUS OR DEPRESSED []   I AM MODERATELY ANXIOUS OR DREPRESSED [x]   I AM SEVERELY ANXIOUS OR DEPRESSED []   I AM EXTREMELY ANXIOUS OR DEPRESSED []     SCALE OF 0-100 HOW WOULD YOU RATE TODAY?  0 IS THE WORSE AND 100 IS THE BEST HEALTH YOU CAN IMAGINE: 85

## 2018-12-17 NOTE — Anesthesia Procedure Notes (Signed)
Central Venous Catheter Insertion Performed by: Suzette Battiest, MD, anesthesiologist Start/End9/01/2019 6:45 AM, 12/17/2018 7:05 AM Patient location: Pre-op. Preanesthetic checklist: patient identified, IV checked, site marked, risks and benefits discussed, surgical consent, monitors and equipment checked, pre-op evaluation, timeout performed and anesthesia consent Position: Trendelenburg Lidocaine 1% used for infiltration and patient sedated Hand hygiene performed , maximum sterile barriers used  and Seldinger technique used Catheter size: 8.5 Fr Total catheter length 10. Central line was placed.Sheath introducer Swan type:thermodilution PA Cath depth:50 Procedure performed using ultrasound guided technique. Ultrasound Notes:anatomy identified, needle tip was noted to be adjacent to the nerve/plexus identified, no ultrasound evidence of intravascular and/or intraneural injection and image(s) printed for medical record Attempts: 2 Following insertion, line sutured, dressing applied and Biopatch. Post procedure assessment: blood return through all ports, free fluid flow and no air  Patient tolerated the procedure well with no immediate complications.

## 2018-12-17 NOTE — Anesthesia Procedure Notes (Signed)
Arterial Line Insertion Start/End9/01/2019 7:39 AM, 12/17/2018 7:39 AM Performed by: Valda Favia, CRNA  Patient location: Pre-op. Preanesthetic checklist: patient identified, IV checked, site marked, risks and benefits discussed, surgical consent, monitors and equipment checked, pre-op evaluation, timeout performed and anesthesia consent Lidocaine 1% used for infiltration Right, radial was placed Catheter size: 20 Fr Hand hygiene performed  and maximum sterile barriers used   Attempts: 2 Procedure performed without using ultrasound guided technique. Following insertion, dressing applied. Post procedure assessment: normal and unchanged

## 2018-12-18 ENCOUNTER — Inpatient Hospital Stay (HOSPITAL_COMMUNITY): Payer: Medicare Other

## 2018-12-18 ENCOUNTER — Encounter (HOSPITAL_COMMUNITY): Payer: Self-pay | Admitting: Thoracic Surgery (Cardiothoracic Vascular Surgery)

## 2018-12-18 LAB — BASIC METABOLIC PANEL
Anion gap: 6 (ref 5–15)
Anion gap: 7 (ref 5–15)
BUN: 10 mg/dL (ref 8–23)
BUN: 16 mg/dL (ref 8–23)
CO2: 20 mmol/L — ABNORMAL LOW (ref 22–32)
CO2: 20 mmol/L — ABNORMAL LOW (ref 22–32)
Calcium: 7.8 mg/dL — ABNORMAL LOW (ref 8.9–10.3)
Calcium: 8.4 mg/dL — ABNORMAL LOW (ref 8.9–10.3)
Chloride: 110 mmol/L (ref 98–111)
Chloride: 107 mmol/L (ref 98–111)
Creatinine, Ser: 0.87 mg/dL (ref 0.44–1.00)
Creatinine, Ser: 1.35 mg/dL — ABNORMAL HIGH (ref 0.44–1.00)
GFR calc Af Amer: >60 mL/min (ref >60)
GFR calc Af Amer: 49 mL/min — ABNORMAL LOW (ref >60)
GFR calc non Af Amer: >60 mL/min (ref >60)
GFR calc non Af Amer: 42 mL/min — ABNORMAL LOW (ref >60)
Glucose, Bld: 108 mg/dL — ABNORMAL HIGH (ref 70–99)
Glucose, Bld: 127 mg/dL — ABNORMAL HIGH (ref 70–99)
Potassium: 3.7 mmol/L (ref 3.5–5.1)
Potassium: 4.4 mmol/L (ref 3.5–5.1)
Sodium: 136 mmol/L (ref 135–145)
Sodium: 134 mmol/L — ABNORMAL LOW (ref 135–145)

## 2018-12-18 LAB — GLUCOSE, CAPILLARY
Glucose-Capillary: 105 mg/dL — ABNORMAL HIGH (ref 70–99)
Glucose-Capillary: 105 mg/dL — ABNORMAL HIGH (ref 70–99)
Glucose-Capillary: 106 mg/dL — ABNORMAL HIGH (ref 70–99)
Glucose-Capillary: 107 mg/dL — ABNORMAL HIGH (ref 70–99)
Glucose-Capillary: 107 mg/dL — ABNORMAL HIGH (ref 70–99)
Glucose-Capillary: 107 mg/dL — ABNORMAL HIGH (ref 70–99)
Glucose-Capillary: 112 mg/dL — ABNORMAL HIGH (ref 70–99)
Glucose-Capillary: 114 mg/dL — ABNORMAL HIGH (ref 70–99)
Glucose-Capillary: 89 mg/dL (ref 70–99)
Glucose-Capillary: 99 mg/dL (ref 70–99)

## 2018-12-18 LAB — CBC
HCT: 25.1 % — ABNORMAL LOW (ref 36.0–46.0)
HCT: 25.5 % — ABNORMAL LOW (ref 36.0–46.0)
Hemoglobin: 8.3 g/dL — ABNORMAL LOW (ref 12.0–15.0)
Hemoglobin: 8.3 g/dL — ABNORMAL LOW (ref 12.0–15.0)
MCH: 32.2 pg (ref 26.0–34.0)
MCH: 31.8 pg (ref 26.0–34.0)
MCHC: 33.1 g/dL (ref 30.0–36.0)
MCHC: 32.5 g/dL (ref 30.0–36.0)
MCV: 97.3 fL (ref 80.0–100.0)
MCV: 97.7 fL (ref 80.0–100.0)
Platelets: 125 10*3/uL — ABNORMAL LOW (ref 150–400)
Platelets: 128 10*3/uL — ABNORMAL LOW (ref 150–400)
RBC: 2.58 MIL/uL — ABNORMAL LOW (ref 3.87–5.11)
RBC: 2.61 MIL/uL — ABNORMAL LOW (ref 3.87–5.11)
RDW: 13.4 % (ref 11.5–15.5)
RDW: 13.6 % (ref 11.5–15.5)
WBC: 9.2 10*3/uL (ref 4.0–10.5)
WBC: 9.1 10*3/uL (ref 4.0–10.5)
nRBC: 0 % (ref 0.0–0.2)
nRBC: 0 % (ref 0.0–0.2)

## 2018-12-18 LAB — MAGNESIUM
Magnesium: 2 mg/dL (ref 1.7–2.4)
Magnesium: 1.9 mg/dL (ref 1.7–2.4)

## 2018-12-18 MED ORDER — POTASSIUM CHLORIDE 10 MEQ/50ML IV SOLN
10.0000 meq | INTRAVENOUS | Status: AC
  Administered 2018-12-18 (×3): 10 meq via INTRAVENOUS
  Filled 2018-12-18: qty 50

## 2018-12-18 MED ORDER — FE FUMARATE-B12-VIT C-FA-IFC PO CAPS
1.0000 | ORAL_CAPSULE | Freq: Every day | ORAL | Status: DC
  Administered 2018-12-20 – 2018-12-23 (×4): 1 via ORAL
  Filled 2018-12-18 (×4): qty 1

## 2018-12-18 MED ORDER — ENOXAPARIN SODIUM 30 MG/0.3ML ~~LOC~~ SOLN
30.0000 mg | Freq: Every day | SUBCUTANEOUS | Status: DC
  Administered 2018-12-19: 30 mg via SUBCUTANEOUS
  Filled 2018-12-18: qty 0.3

## 2018-12-18 MED ORDER — FUROSEMIDE 10 MG/ML IJ SOLN
20.0000 mg | Freq: Two times a day (BID) | INTRAMUSCULAR | Status: DC
  Administered 2018-12-18 – 2018-12-19 (×3): 20 mg via INTRAVENOUS
  Filled 2018-12-18 (×3): qty 2

## 2018-12-18 MED ORDER — CLOPIDOGREL BISULFATE 75 MG PO TABS
75.0000 mg | ORAL_TABLET | Freq: Every day | ORAL | Status: DC
  Administered 2018-12-19 – 2018-12-23 (×5): 75 mg via ORAL
  Filled 2018-12-18 (×5): qty 1

## 2018-12-18 MED ORDER — ASPIRIN EC 81 MG PO TBEC
81.0000 mg | DELAYED_RELEASE_TABLET | Freq: Every day | ORAL | Status: DC
  Administered 2018-12-19 – 2018-12-20 (×2): 81 mg via ORAL
  Filled 2018-12-18 (×2): qty 1

## 2018-12-18 MED ORDER — INSULIN ASPART 100 UNIT/ML ~~LOC~~ SOLN
0.0000 [IU] | SUBCUTANEOUS | Status: DC
  Administered 2018-12-19: 2 [IU] via SUBCUTANEOUS

## 2018-12-18 MED ORDER — PANTOPRAZOLE SODIUM 40 MG PO TBEC
40.0000 mg | DELAYED_RELEASE_TABLET | Freq: Every day | ORAL | Status: DC
  Administered 2018-12-18 – 2018-12-20 (×3): 40 mg via ORAL
  Filled 2018-12-18 (×4): qty 1

## 2018-12-18 MED ORDER — ASPIRIN EC 325 MG PO TBEC
325.0000 mg | DELAYED_RELEASE_TABLET | Freq: Every day | ORAL | Status: AC
  Administered 2018-12-18: 325 mg via ORAL
  Filled 2018-12-18: qty 1

## 2018-12-18 MED ORDER — KETOROLAC TROMETHAMINE 15 MG/ML IJ SOLN
15.0000 mg | Freq: Four times a day (QID) | INTRAMUSCULAR | Status: AC
  Administered 2018-12-18 – 2018-12-19 (×3): 15 mg via INTRAVENOUS
  Filled 2018-12-18 (×3): qty 1

## 2018-12-18 MED ORDER — KETOROLAC TROMETHAMINE 15 MG/ML IJ SOLN
15.0000 mg | Freq: Four times a day (QID) | INTRAMUSCULAR | Status: DC
  Administered 2018-12-18: 15 mg via INTRAVENOUS
  Filled 2018-12-18: qty 1

## 2018-12-18 NOTE — Research (Signed)
[facility] Research Study Observational Arm  Obs Visit 3 urine collected @0755 .

## 2018-12-18 NOTE — Progress Notes (Addendum)
      [address]       [place] [address]             [phone]        CARDIOTHORACIC SURGERY PROGRESS NOTE   R1 Day Post-Op Procedure(s) (LRB): CORONARY ARTERY BYPASS GRAFTING (CABG), ON PUMP, TIMES THREE, USING LEFT INTERNAL MAMMARY ARTERY AND ENDOSCOPICALLY HARVESTED RIGHT GREATER SAPHENOUS VEIN (N/A) TRANSESOPHAGEAL ECHOCARDIOGRAM (TEE) (N/A)  Subjective: Complains that pain medications not working.  Sore across chest, upper back, both shoulders.  Denies SOB.  No nausea.  Objective: Vital signs: BP Readings from Last 1 Encounters:  12/18/18 93/61   Pulse Readings from Last 1 Encounters:  12/18/18 91   Resp Readings from Last 1 Encounters:  12/18/18 (!) 22   Temp Readings from Last 1 Encounters:  12/18/18 98.8 F (37.1 C)    Hemodynamics: PAP: (16-23)/(6-15) 21/10 CO:  [3 L/min-5.4 L/min] 5 L/min CI:  [1.6 L/min/m2-2.8 L/min/m2] 2.6 L/min/m2  Physical Exam:  Rhythm:   sinus  Breath sounds: clear  Heart sounds:  RRR  Incisions:  Dressings dry, intact  Abdomen:  Soft, non-distended, non-tender  Extremities:  Warm, well-perfused  Chest tubes:  low volume thin serosanguinous output, no air leak    Intake/Output from previous day: 09/10 0701 - 09/11 0700 In: 5887.1 [I.V.:4405.3; Blood:350; IV Piggyback:1131.8] Out: 3900 [Urine:2530; Blood:610; Chest Tube:760] Intake/Output this shift: Total I/O In: 42.1 [I.V.:42.1] Out: 10 [Chest Tube:10]  Lab Results:  CBC: Recent Labs    12/17/18 1837 12/18/18 0356  WBC 9.8 9.2  HGB 8.4* 8.3*  HCT 25.6* 25.1*  PLT 115* 125*    BMET:  Recent Labs    12/17/18 1837 12/18/18 0356  NA 140 136  K 3.9 3.7  CL 113* 110  CO2 19* 20*  GLUCOSE 125* 108*  BUN 9 10  CREATININE 0.76 0.87  CALCIUM 7.6* 7.8*     PT/INR:   Recent Labs    12/17/18 1322  LABPROT 17.3*  INR 1.4*    CBG (last 3)  Recent Labs    12/18/18 0203 12/18/18 0312 12/18/18 0519  GLUCAP 107* 107* 106*    ABG     Component Value Date/Time   PHART 7.341 (L) 12/17/2018 1801   PCO2ART 36.7 12/17/2018 1801   PO2ART 132.0 (H) 12/17/2018 1801   HCO3 19.9 (L) 12/17/2018 1801   TCO2 21 (L) 12/17/2018 1801   ACIDBASEDEF 5.0 (H) 12/17/2018 1801   O2SAT 99.0 12/17/2018 1801    CXR: Clear  EKG: NSR w/out acute ischemic changes, RBBB (old)   Assessment/Plan: S/P Procedure(s) (LRB): CORONARY ARTERY BYPASS GRAFTING (CABG), ON PUMP, TIMES THREE, USING LEFT INTERNAL MAMMARY ARTERY AND ENDOSCOPICALLY HARVESTED RIGHT GREATER SAPHENOUS VEIN (N/A) TRANSESOPHAGEAL ECHOCARDIOGRAM (TEE) (N/A)  Doing well POD1 s/p CABG for unstable angina Maintaining NSR w/ stable hemodynamics, no drips Breathing comfortably w/ O2 sats 97% on room air, CXR clear Expected post op acute blood loss anemia, Hgb 8.3 stable Expected post op atelectasis, very mild Expected post op volume excess, weight reportedly 5 kg > preop, UOP adequate Type II diabetes mellitus, excellent glycemic control   Add toradol for pain control  Mobilize  D/C lines  D/C tubes later today or tomorrow, depending on output  Gentle diuresis  Watch anemia, add iron  DAPT  Statin  Beta blocker  Transition off insulin drip and convert CBG's and SSI to every 4 hours   Rexene Alberts, MD 12/18/2018 8:30 AM

## 2018-12-18 NOTE — Progress Notes (Signed)
Patient ID: Christine Skinner, female   DOB: 01-17-1956, 64 y.o.   MRN: [id number] TCTS Evening Rounds:  Hemodynamically stable in sinus rhythm. BP on the low side sats 96% 2L. Urine output ok CT's still draining.   Ambulated today.  Labs pending this pm.

## 2018-12-19 ENCOUNTER — Inpatient Hospital Stay (HOSPITAL_COMMUNITY): Payer: Medicare Other

## 2018-12-19 LAB — BASIC METABOLIC PANEL
Anion gap: 7 (ref 5–15)
BUN: 17 mg/dL (ref 8–23)
CO2: 20 mmol/L — ABNORMAL LOW (ref 22–32)
Calcium: 8.3 mg/dL — ABNORMAL LOW (ref 8.9–10.3)
Chloride: 107 mmol/L (ref 98–111)
Creatinine, Ser: 1.24 mg/dL — ABNORMAL HIGH (ref 0.44–1.00)
GFR calc Af Amer: 54 mL/min — ABNORMAL LOW (ref >60)
GFR calc non Af Amer: 47 mL/min — ABNORMAL LOW (ref >60)
Glucose, Bld: 117 mg/dL — ABNORMAL HIGH (ref 70–99)
Potassium: 4.3 mmol/L (ref 3.5–5.1)
Sodium: 134 mmol/L — ABNORMAL LOW (ref 135–145)

## 2018-12-19 LAB — CBC
HCT: 22.3 % — ABNORMAL LOW (ref 36.0–46.0)
Hemoglobin: 7.1 g/dL — ABNORMAL LOW (ref 12.0–15.0)
MCH: 31.7 pg (ref 26.0–34.0)
MCHC: 31.8 g/dL (ref 30.0–36.0)
MCV: 99.6 fL (ref 80.0–100.0)
Platelets: 108 10*3/uL — ABNORMAL LOW (ref 150–400)
RBC: 2.24 MIL/uL — ABNORMAL LOW (ref 3.87–5.11)
RDW: 13.6 % (ref 11.5–15.5)
WBC: 8.3 10*3/uL (ref 4.0–10.5)
nRBC: 0 % (ref 0.0–0.2)

## 2018-12-19 LAB — GLUCOSE, CAPILLARY
Glucose-Capillary: 105 mg/dL — ABNORMAL HIGH (ref 70–99)
Glucose-Capillary: 125 mg/dL — ABNORMAL HIGH (ref 70–99)
Glucose-Capillary: 34 mg/dL — CL (ref 70–99)

## 2018-12-19 LAB — PREPARE RBC (CROSSMATCH)

## 2018-12-19 LAB — ECHO INTRAOPERATIVE TEE
Height: 66 in
Weight: 2816 oz

## 2018-12-19 MED ORDER — DEXTROSE 50 % IV SOLN
INTRAVENOUS | Status: AC
  Administered 2018-12-19: 50 mL
  Filled 2018-12-19: qty 100

## 2018-12-19 MED ORDER — POTASSIUM CHLORIDE CRYS ER 20 MEQ PO TBCR
20.0000 meq | EXTENDED_RELEASE_TABLET | Freq: Once | ORAL | Status: AC
  Administered 2018-12-19: 21:00:00 20 meq via ORAL
  Filled 2018-12-19: qty 1

## 2018-12-19 MED ORDER — SODIUM CHLORIDE 0.9% IV SOLUTION
Freq: Once | INTRAVENOUS | Status: AC
  Administered 2018-12-19: 1 mL via INTRAVENOUS

## 2018-12-19 MED ORDER — FUROSEMIDE 10 MG/ML IJ SOLN
40.0000 mg | Freq: Once | INTRAMUSCULAR | Status: AC
  Administered 2018-12-19: 40 mg via INTRAVENOUS
  Filled 2018-12-19: qty 4

## 2018-12-19 MED ORDER — TRAMADOL HCL 50 MG PO TABS: 50.0000 mg | ORAL_TABLET | ORAL | Status: DC | PRN

## 2018-12-19 MED ORDER — DEXTROSE 50 % IV SOLN
25.0000 g | INTRAVENOUS | Status: AC
  Administered 2018-12-19: 25 g via INTRAVENOUS

## 2018-12-19 NOTE — Anesthesia Postprocedure Evaluation (Signed)
Anesthesia Post Note  Patient: Christine Skinner  Procedure(s) Performed: CORONARY ARTERY BYPASS GRAFTING (CABG), ON PUMP, TIMES THREE, USING LEFT INTERNAL MAMMARY ARTERY AND ENDOSCOPICALLY HARVESTED RIGHT GREATER SAPHENOUS VEIN (N/A Chest) TRANSESOPHAGEAL ECHOCARDIOGRAM (TEE) (N/A )     Patient location during evaluation: SICU Anesthesia Type: General Level of consciousness: sedated Pain management: pain level controlled Vital Signs Assessment: post-procedure vital signs reviewed and stable Respiratory status: patient remains intubated per anesthesia plan Cardiovascular status: stable Postop Assessment: no apparent nausea or vomiting Anesthetic complications: no    Last Vitals:  Vitals:   12/19/18 0500 12/19/18 0600  BP: (!) 102/56   Pulse: 67 76  Resp: 16 19  Temp:    SpO2: 93% 96%    Last Pain:  Vitals:   12/19/18 0655  TempSrc:   PainSc: [place]

## 2018-12-19 NOTE — Progress Notes (Signed)
2 Days Post-Op Procedure(s) (LRB): CORONARY ARTERY BYPASS GRAFTING (CABG), ON PUMP, TIMES THREE, USING LEFT INTERNAL MAMMARY ARTERY AND ENDOSCOPICALLY HARVESTED RIGHT GREATER SAPHENOUS VEIN (N/A) TRANSESOPHAGEAL ECHOCARDIOGRAM (TEE) (N/A) Subjective: Complains of back pain that feels like a knife.   Objective: Vital signs in last 24 hours: Temp:  [97.7 F (36.5 C)-98.6 F (37 C)] 98.3 F (36.8 C) (09/12 1206) Pulse Rate:  [65-95] 72 (09/12 0904) Cardiac Rhythm: Normal sinus rhythm (09/12 0800) Resp:  [14-39] 18 (09/12 0900) BP: (78-139)/(41-106) 119/58 (09/12 0904) SpO2:  [90 %-98 %] 94 % (09/12 0900) Weight:  [90.7 kg] 90.7 kg (09/12 0500)  Hemodynamic parameters for last 24 hours:    Intake/Output from previous day: 09/11 0701 - 09/12 0700 In: 1135.3 [P.O.:540; I.V.:278.3; IV Piggyback:317] Out: 880 [Urine:540; Chest Tube:340] Intake/Output this shift: Total I/O In: -  Out: 110 [Chest Tube:110]  General appearance: alert and cooperative Neurologic: intact Heart: regular rate and rhythm, S1, S2 normal, no murmur Lungs: clear to auscultation bilaterally Extremities: edema moderate Wound: dressings dry  Lab Results: Recent Labs    12/18/18 1700 12/19/18 0438  WBC 9.1 8.3  HGB 8.3* 7.1*  HCT 25.5* 22.3*  PLT 128* 108*   BMET:  Recent Labs    12/18/18 1700 12/19/18 0438  NA 134* 134*  K 4.4 4.3  CL 107 107  CO2 20* 20*  GLUCOSE 127* 117*  BUN 16 17  CREATININE 1.35* 1.24*  CALCIUM 8.4* 8.3*    PT/INR:  Recent Labs    12/17/18 1322  LABPROT 17.3*  INR 1.4*   ABG    Component Value Date/Time   PHART 7.341 (L) 12/17/2018 1801   HCO3 19.9 (L) 12/17/2018 1801   TCO2 21 (L) 12/17/2018 1801   ACIDBASEDEF 5.0 (H) 12/17/2018 1801   O2SAT 99.0 12/17/2018 1801   CBG (last 3)  Recent Labs    12/19/18 0411 12/19/18 0829 12/19/18 1210  GLUCAP 105* 125* 34*   CXR: stable  Assessment/Plan: S/P Procedure(s) (LRB): CORONARY ARTERY BYPASS GRAFTING  (CABG), ON PUMP, TIMES THREE, USING LEFT INTERNAL MAMMARY ARTERY AND ENDOSCOPICALLY HARVESTED RIGHT GREATER SAPHENOUS VEIN (N/A) TRANSESOPHAGEAL ECHOCARDIOGRAM (TEE) (N/A)  POD2  Hemodynamically stable in sinus rhythm. BP has been low normal range so will hold Lopessor for now.  Expected acute postop blood loss anemia: Hgb has drifted down to 7.1 so will transfuse 1 unit PRBC's and continue iron.   Volume excess: wt is 20 lbs over preop. Continue diuresis after transfusion.  Hypoglycemia this am: preop Hgb A1c was 5.6 on metformin 500 mg daily. Will stop CBG's and SSI.  Mild elevation of creat that may have been due to Toradol which has stopped. Creat coming back down.  Continue IS, ambulation.   LOS: 4 days    Christine Skinner 12/19/2018

## 2018-12-19 NOTE — Progress Notes (Signed)
Patient ID: Christine Skinner, female   DOB: 1955/11/30, 63 y.o.   MRN: [id number] TCTS Evening Rounds:  Hemodynamically stable in sinus rhythm.  sats 100%  Transfused today   Diuresed after transfusion. Will give her another dose of lasix this pm.

## 2018-12-20 ENCOUNTER — Inpatient Hospital Stay (HOSPITAL_COMMUNITY): Payer: Medicare Other

## 2018-12-20 LAB — CBC
HCT: 25.7 % — ABNORMAL LOW (ref 36.0–46.0)
Hemoglobin: 8.7 g/dL — ABNORMAL LOW (ref 12.0–15.0)
MCH: 31.6 pg (ref 26.0–34.0)
MCHC: 33.9 g/dL (ref 30.0–36.0)
MCV: 93.5 fL (ref 80.0–100.0)
Platelets: 122 10*3/uL — ABNORMAL LOW (ref 150–400)
RBC: 2.75 MIL/uL — ABNORMAL LOW (ref 3.87–5.11)
RDW: 14.6 % (ref 11.5–15.5)
WBC: 10.5 10*3/uL (ref 4.0–10.5)
nRBC: 0 % (ref 0.0–0.2)

## 2018-12-20 LAB — TYPE AND SCREEN
ABO/RH(D): A NEG
Antibody Screen: NEGATIVE
Unit division: 0

## 2018-12-20 LAB — BASIC METABOLIC PANEL
Anion gap: 9 (ref 5–15)
BUN: 14 mg/dL (ref 8–23)
CO2: 24 mmol/L (ref 22–32)
Calcium: 8.3 mg/dL — ABNORMAL LOW (ref 8.9–10.3)
Chloride: 104 mmol/L (ref 98–111)
Creatinine, Ser: 1.02 mg/dL — ABNORMAL HIGH (ref 0.44–1.00)
GFR calc Af Amer: >60 mL/min (ref >60)
GFR calc non Af Amer: 59 mL/min — ABNORMAL LOW (ref >60)
Glucose, Bld: 112 mg/dL — ABNORMAL HIGH (ref 70–99)
Potassium: 3.7 mmol/L (ref 3.5–5.1)
Sodium: 137 mmol/L (ref 135–145)

## 2018-12-20 LAB — BPAM RBC
Blood Product Expiration Date: 202010012359
ISSUE DATE / TIME: 202009121346
Unit Type and Rh: 600

## 2018-12-20 LAB — GLUCOSE, CAPILLARY: Glucose-Capillary: 105 mg/dL — ABNORMAL HIGH (ref 70–99)

## 2018-12-20 MED ORDER — TRAMADOL HCL 50 MG PO TABS
50.0000 mg | ORAL_TABLET | Freq: Four times a day (QID) | ORAL | Status: DC | PRN
  Administered 2018-12-20 – 2018-12-21 (×2): 50 mg via ORAL
  Filled 2018-12-20 (×2): qty 1

## 2018-12-20 MED ORDER — POTASSIUM CHLORIDE CRYS ER 20 MEQ PO TBCR
20.0000 meq | EXTENDED_RELEASE_TABLET | Freq: Two times a day (BID) | ORAL | Status: DC
  Administered 2018-12-21 – 2018-12-23 (×5): 20 meq via ORAL
  Filled 2018-12-20 (×5): qty 1

## 2018-12-20 MED ORDER — PANTOPRAZOLE SODIUM 40 MG PO TBEC
40.0000 mg | DELAYED_RELEASE_TABLET | Freq: Every day | ORAL | Status: DC
  Administered 2018-12-21 – 2018-12-23 (×3): 40 mg via ORAL
  Filled 2018-12-20 (×3): qty 1

## 2018-12-20 MED ORDER — SODIUM CHLORIDE 0.9% FLUSH: 3.0000 mL | INTRAVENOUS | Status: DC | PRN

## 2018-12-20 MED ORDER — ACETAMINOPHEN 325 MG PO TABS: 650.0000 mg | ORAL_TABLET | Freq: Four times a day (QID) | ORAL | Status: DC | PRN

## 2018-12-20 MED ORDER — ONDANSETRON HCL 4 MG/2ML IJ SOLN
4.0000 mg | Freq: Four times a day (QID) | INTRAMUSCULAR | Status: DC | PRN
  Administered 2018-12-20: 4 mg via INTRAVENOUS
  Filled 2018-12-20: qty 2

## 2018-12-20 MED ORDER — OXYCODONE HCL 5 MG PO TABS
5.0000 mg | ORAL_TABLET | ORAL | Status: DC | PRN
  Administered 2018-12-20 (×2): 10 mg via ORAL
  Administered 2018-12-20 – 2018-12-21 (×7): 5 mg via ORAL
  Administered 2018-12-22 – 2018-12-23 (×6): 10 mg via ORAL
  Filled 2018-12-20 (×2): qty 2
  Filled 2018-12-20: qty 1
  Filled 2018-12-20: qty 2
  Filled 2018-12-20 (×5): qty 1
  Filled 2018-12-20 (×2): qty 2
  Filled 2018-12-20 (×4): qty 1
  Filled 2018-12-20: qty 2

## 2018-12-20 MED ORDER — MOVING RIGHT ALONG BOOK
Freq: Once | Status: AC
  Administered 2018-12-20: 11:00:00
  Filled 2018-12-20: qty 1

## 2018-12-20 MED ORDER — ASPIRIN EC 81 MG PO TBEC
81.0000 mg | DELAYED_RELEASE_TABLET | Freq: Every day | ORAL | Status: DC
  Administered 2018-12-21 – 2018-12-23 (×3): 81 mg via ORAL
  Filled 2018-12-20 (×3): qty 1

## 2018-12-20 MED ORDER — SODIUM CHLORIDE 0.9 % IV SOLN: 250.0000 mL | INTRAVENOUS | Status: DC | PRN

## 2018-12-20 MED ORDER — FUROSEMIDE 40 MG PO TABS
40.0000 mg | ORAL_TABLET | Freq: Every day | ORAL | Status: AC
  Administered 2018-12-20 – 2018-12-23 (×4): 40 mg via ORAL
  Filled 2018-12-20 (×4): qty 1

## 2018-12-20 MED ORDER — SODIUM CHLORIDE 0.9% FLUSH
3.0000 mL | Freq: Two times a day (BID) | INTRAVENOUS | Status: DC
  Administered 2018-12-20 – 2018-12-23 (×6): 3 mL via INTRAVENOUS

## 2018-12-20 MED ORDER — METOPROLOL TARTRATE 12.5 MG HALF TABLET
12.5000 mg | ORAL_TABLET | Freq: Two times a day (BID) | ORAL | Status: DC
  Administered 2018-12-20 – 2018-12-23 (×7): 12.5 mg via ORAL
  Filled 2018-12-20 (×7): qty 1

## 2018-12-20 MED ORDER — ONDANSETRON HCL 4 MG PO TABS: 4.0000 mg | ORAL_TABLET | Freq: Four times a day (QID) | ORAL | Status: DC | PRN

## 2018-12-20 MED ORDER — POTASSIUM CHLORIDE CRYS ER 20 MEQ PO TBCR
20.0000 meq | EXTENDED_RELEASE_TABLET | ORAL | Status: DC
  Administered 2018-12-20: 20 meq via ORAL
  Filled 2018-12-20: qty 1

## 2018-12-20 MED ORDER — DOCUSATE SODIUM 100 MG PO CAPS
200.0000 mg | ORAL_CAPSULE | Freq: Every day | ORAL | Status: DC
  Administered 2018-12-21 – 2018-12-23 (×3): 200 mg via ORAL
  Filled 2018-12-20 (×3): qty 2

## 2018-12-20 MED ORDER — CHLORHEXIDINE GLUCONATE CLOTH 2 % EX PADS
6.0000 | MEDICATED_PAD | Freq: Every day | CUTANEOUS | Status: DC
  Administered 2018-12-20: 6 via TOPICAL

## 2018-12-20 MED ORDER — GUAIFENESIN-DM 100-10 MG/5ML PO SYRP
15.0000 mL | ORAL_SOLUTION | ORAL | Status: DC | PRN
  Administered 2018-12-20 – 2018-12-21 (×2): 15 mL via ORAL
  Filled 2018-12-20 (×2): qty 15

## 2018-12-20 NOTE — Progress Notes (Signed)
3 Days Post-Op Procedure(s) (LRB): CORONARY ARTERY BYPASS GRAFTING (CABG), ON PUMP, TIMES THREE, USING LEFT INTERNAL MAMMARY ARTERY AND ENDOSCOPICALLY HARVESTED RIGHT GREATER SAPHENOUS VEIN (N/A) TRANSESOPHAGEAL ECHOCARDIOGRAM (TEE) (N/A) Subjective: Feels much better today. Ambulated 2 laps.  Objective: Vital signs in last 24 hours: Temp:  [97.8 F (36.6 C)-99.8 F (37.7 C)] 98.3 F (36.8 C) (09/13 0834) Pulse Rate:  [71-98] 95 (09/13 0800) Cardiac Rhythm: Normal sinus rhythm (09/13 0800) Resp:  [16-30] 20 (09/13 0800) BP: (80-141)/(52-97) 95/82 (09/13 0800) SpO2:  [83 %-100 %] 98 % (09/13 0800) Weight:  [86.6 kg] 86.6 kg (09/13 0630)  Hemodynamic parameters for last 24 hours:    Intake/Output from previous day: 09/12 0701 - 09/13 0700 In: 672.7 [P.O.:310; I.V.:242.7; Blood:120] Out: 8469 [Urine:3450; Chest Tube:110] Intake/Output this shift: Total I/O In: 240 [P.O.:240] Out: -   General appearance: alert and cooperative Neurologic: intact Heart: regular rate and rhythm, S1, S2 normal, no murmur, click, rub or gallop Lungs: clear to auscultation bilaterally Extremities: edema mild Wound: incisions ok  Lab Results: Recent Labs    12/19/18 0438 12/20/18 0500  WBC 8.3 10.5  HGB 7.1* 8.7*  HCT 22.3* 25.7*  PLT 108* 122*   BMET:  Recent Labs    12/19/18 0438 12/20/18 0500  NA 134* 137  K 4.3 3.7  CL 107 104  CO2 20* 24  GLUCOSE 117* 112*  BUN 17 14  CREATININE 1.24* 1.02*  CALCIUM 8.3* 8.3*    PT/INR:  Recent Labs    12/17/18 1322  LABPROT 17.3*  INR 1.4*   ABG    Component Value Date/Time   PHART 7.341 (L) 12/17/2018 1801   HCO3 19.9 (L) 12/17/2018 1801   TCO2 21 (L) 12/17/2018 1801   ACIDBASEDEF 5.0 (H) 12/17/2018 1801   O2SAT 99.0 12/17/2018 1801   CBG (last 3)  Recent Labs    12/19/18 0829 12/19/18 1210 12/19/18 1309  GLUCAP 125* 34* 105*   CXR: clear  Assessment/Plan: S/P Procedure(s) (LRB): CORONARY ARTERY BYPASS GRAFTING  (CABG), ON PUMP, TIMES THREE, USING LEFT INTERNAL MAMMARY ARTERY AND ENDOSCOPICALLY HARVESTED RIGHT GREATER SAPHENOUS VEIN (N/A) TRANSESOPHAGEAL ECHOCARDIOGRAM (TEE) (N/A)  POD 3  Hemodynamically stable in sinus rhythm. Will resume low dose Lopressor.  Expected acute postop blood loss anemia: Hgb improved with transfusion. Feels like she has energy today. Continue iron.  Volume excess: Wt down 9 lbs from yesterday but still 14 lbs over preop by wt.  DC sleeve.  Transfer to 4E and continue IS, ambulation.   LOS: 5 days    Gaye Pollack 12/20/2018

## 2018-12-20 NOTE — Plan of Care (Signed)
  Problem: Education: Goal: Knowledge of General Education information will improve Description: Including pain rating scale, medication(s)/side effects and non-pharmacologic comfort measures Outcome: Progressing   Problem: Education: Goal: Knowledge of General Education information will improve Description: Including pain rating scale, medication(s)/side effects and non-pharmacologic comfort measures Outcome: Progressing   Problem: Clinical Measurements: Goal: Ability to maintain clinical measurements within normal limits will improve Outcome: Progressing Goal: Respiratory complications will improve Outcome: Progressing Goal: Cardiovascular complication will be avoided Outcome: Progressing   Problem: Activity: Goal: Risk for activity intolerance will decrease Outcome: Progressing   Problem: Nutrition: Goal: Adequate nutrition will be maintained Outcome: Progressing   Problem: Coping: Goal: Level of anxiety will decrease Outcome: Progressing   Problem: Pain Managment: Goal: General experience of comfort will improve Outcome: Progressing   Problem: Safety: Goal: Ability to remain free from injury will improve Outcome: Progressing   Problem: Skin Integrity: Goal: Risk for impaired skin integrity will decrease Outcome: Progressing   Problem: Activity: Goal: Ability to return to baseline activity level will improve Outcome: Progressing   Problem: Cardiovascular: Goal: Ability to achieve and maintain adequate cardiovascular perfusion will improve Outcome: Progressing Goal: Vascular access site(s) Level 0-1 will be maintained Outcome: Progressing

## 2018-12-21 MED ORDER — LACTULOSE 10 GM/15ML PO SOLN
20.0000 g | Freq: Every day | ORAL | Status: DC | PRN
  Administered 2018-12-21: 20 g via ORAL
  Filled 2018-12-21: qty 30

## 2018-12-21 MED ORDER — DM-GUAIFENESIN ER 30-600 MG PO TB12
1.0000 | ORAL_TABLET | Freq: Two times a day (BID) | ORAL | Status: DC | PRN
  Administered 2018-12-21 – 2018-12-22 (×2): 1 via ORAL
  Filled 2018-12-21 (×3): qty 1

## 2018-12-21 NOTE — Progress Notes (Signed)
Patient ambulated in hallway 970 feet with rolling walker. Gait slow but steady. Patient tolerated well back in chair. Will monitor patient. Skyley Grandmaison, Bettina Gavia RN

## 2018-12-21 NOTE — Progress Notes (Signed)
CARDIAC REHAB PHASE I   PRE:  Rate/Rhythm: 74 SR    BP: sitting 121/79    SaO2: 97 RA  MODE:  Ambulation: 970 ft   POST:  Rate/Rhythm: 111 sT with PVC    BP: sitting 124/82     SaO2: 97 RA  Pt eager to walk. Able to move mostly independent from bed, just slow. Used RW with slow pace in hall. Eager to walk far. No major c/o, likes to walk. To recliner. Encouraged more walking and IS. Pt doing well, thinks she has a RW at home but it is in storage. Encouraged her to get it. [place], ACSM 12/21/2018 10:25 AM

## 2018-12-21 NOTE — Progress Notes (Signed)
Patient with 6 beat run of Vtach on monitor. Asymptomatic  Patient was coming out of bathroom had just had bowel movement. Patient in chair. bp 107/57 now NSr 87 will monitor patient. Mattson Dayal, Bettina Gavia RN

## 2018-12-21 NOTE — Progress Notes (Signed)
Patient EPW pulled per protocol and as ordered. All ends intact. Right atrial wires, met with some resistance but then removed. bp 107/62 heart rate 73 on monitor. Patient reminded to lie supine approximately one hour. Will monitor patient.Nelda Bucks, Bettina Gavia RN

## 2018-12-21 NOTE — Progress Notes (Addendum)
      [address]       [place] [address]             [phone]      4 Days Post-Op Procedure(s) (LRB): CORONARY ARTERY BYPASS GRAFTING (CABG), ON PUMP, TIMES THREE, USING LEFT INTERNAL MAMMARY ARTERY AND ENDOSCOPICALLY HARVESTED RIGHT GREATER SAPHENOUS VEIN (N/A) TRANSESOPHAGEAL ECHOCARDIOGRAM (TEE) (N/A)   Subjective:  Patient continues to have pain.  She also states she is having difficulty with expectoration of sputum.  + ambulation  No BM yet, + Flatus  Objective: Vital signs in last 24 hours: Temp:  [98.2 F (36.8 C)-99.3 F (37.4 C)] 98.2 F (36.8 C) (09/14 0559) Pulse Rate:  [77-95] 94 (09/14 0559) Cardiac Rhythm: Normal sinus rhythm;Bundle branch block (09/13 1902) Resp:  [15-23] 23 (09/14 0559) BP: (95-141)/(56-93) 129/71 (09/14 0559) SpO2:  [91 %-100 %] 97 % (09/14 0559) FiO2 (%):  [21 %] 21 % (09/13 1036) Weight:  [85.5 kg] 85.5 kg (09/14 0500)  Intake/Output from previous day: 09/13 0701 - 09/14 0700 In: 840 [P.O.:840] Out: 2275 [Urine:2275]  General appearance: alert, cooperative and no distress Heart: regular rate and rhythm Lungs: clear to auscultation bilaterally Abdomen: soft, non-tender; bowel sounds normal; no masses,  no organomegaly Extremities: edema trace Wound: clean and dry, ecchymosis RLE  Lab Results: Recent Labs    12/19/18 0438 12/20/18 0500  WBC 8.3 10.5  HGB 7.1* 8.7*  HCT 22.3* 25.7*  PLT 108* 122*   BMET:  Recent Labs    12/19/18 0438 12/20/18 0500  NA 134* 137  K 4.3 3.7  CL 107 104  CO2 20* 24  GLUCOSE 117* 112*  BUN 17 14  CREATININE 1.24* 1.02*  CALCIUM 8.3* 8.3*    PT/INR: No results for input(s): LABPROT, INR in the last 72 hours. ABG    Component Value Date/Time   PHART 7.341 (L) 12/17/2018 1801   HCO3 19.9 (L) 12/17/2018 1801   TCO2 21 (L) 12/17/2018 1801   ACIDBASEDEF 5.0 (H) 12/17/2018 1801   O2SAT 99.0 12/17/2018 1801   CBG (last 3)  Recent Labs    12/19/18 0829 12/19/18  1210 12/19/18 1309  GLUCAP 125* 34* 105*    Assessment/Plan: S/P Procedure(s) (LRB): CORONARY ARTERY BYPASS GRAFTING (CABG), ON PUMP, TIMES THREE, USING LEFT INTERNAL MAMMARY ARTERY AND ENDOSCOPICALLY HARVESTED RIGHT GREATER SAPHENOUS VEIN (N/A) TRANSESOPHAGEAL ECHOCARDIOGRAM (TEE) (N/A)  1. CV- NSR, BP has been labile- on lopressor at 12.5 mg BID 2. Pulm- off oxygen, good sats, difficulty coughing up phlegm, will start Mucinex 3. Renal- creatinine has been stable, weight is trending down, on Lasix 4. Expected post operative blood loss anemia, improved up to 8.7, continue iron 5. GI- no BM yet, passing gas. Will add lactulose prn 6. Expected thrombocytopenia, mild count at 122 7. Dispo- patient stable, will dc EPW today, continue lasix, continue ambulation, possibly ready for d/c in next 24-48 hours   LOS: 6 days    Ellwood Handler 12/21/2018   I have seen and examined the patient and agree with the assessment and plan as outlined.  Rexene Alberts, MD 12/21/2018 8:22 AM

## 2018-12-21 NOTE — Care Management Important Message (Signed)
Important Message  Patient Details  Name: Christine Skinner MRN: [id number] Date of Birth: 07/31/1955   Medicare Important Message Given:  Yes     Shelda Altes 12/21/2018, 1:43 PM

## 2018-12-22 MED FILL — Mannitol IV Soln 20%: INTRAVENOUS | Qty: 500 | Status: AC

## 2018-12-22 MED FILL — Magnesium Sulfate Inj 50%: INTRAMUSCULAR | Qty: 10 | Status: AC

## 2018-12-22 MED FILL — Electrolyte-R (PH 7.4) Solution: INTRAVENOUS | Qty: 5000 | Status: AC

## 2018-12-22 MED FILL — Sodium Chloride IV Soln 0.9%: INTRAVENOUS | Qty: 2000 | Status: AC

## 2018-12-22 MED FILL — Albumin, Human Inj 5%: INTRAVENOUS | Qty: 250 | Status: AC

## 2018-12-22 MED FILL — Heparin Sodium (Porcine) Inj 1000 Unit/ML: INTRAMUSCULAR | Qty: 30 | Status: AC

## 2018-12-22 MED FILL — Potassium Chloride Inj 2 mEq/ML: INTRAVENOUS | Qty: 40 | Status: AC

## 2018-12-22 MED FILL — Lidocaine HCl Local Soln Prefilled Syringe 100 MG/5ML (2%): INTRAMUSCULAR | Qty: 5 | Status: AC

## 2018-12-22 MED FILL — Sodium Bicarbonate IV Soln 8.4%: INTRAVENOUS | Qty: 50 | Status: AC

## 2018-12-22 MED FILL — Heparin Sodium (Porcine) Inj 1000 Unit/ML: INTRAMUSCULAR | Qty: 10 | Status: AC

## 2018-12-22 NOTE — Progress Notes (Signed)
CARDIAC REHAB PHASE I   PRE:  Rate/Rhythm: 107 ST    BP: sitting 124/87    SaO2:   MODE:  Ambulation: 850 ft   POST:  Rate/Rhythm: 107 ST    BP: sitting 102/91     SaO2: 97 RA  Pt remains very motivated. In pain this am, sternum and back pain. Took meds and walked with RW. Steady and slow pace with RW. She is wobbly when she is not holding to RW, she sts this is due to pain. VSS, diastolic somewhat high. She sts her BP was high earlier due to a stressful phone call. To bed after walk. Will walk again later. Doing well with IS. Left ed materials to begin reading. [phone]   [place], ACSM 12/22/2018 11:06 AM

## 2018-12-22 NOTE — Progress Notes (Addendum)
      [address]       [place] [address]             [phone]      5 Days Post-Op Procedure(s) (LRB): CORONARY ARTERY BYPASS GRAFTING (CABG), ON PUMP, TIMES THREE, USING LEFT INTERNAL MAMMARY ARTERY AND ENDOSCOPICALLY HARVESTED RIGHT GREATER SAPHENOUS VEIN (N/A) TRANSESOPHAGEAL ECHOCARDIOGRAM (TEE) (N/A)   Subjective:  No new complaints.  Feeling better today.  + ambulation without significant difficulty.  + BM  Objective: Vital signs in last 24 hours: Temp:  [97.5 F (36.4 C)-98.9 F (37.2 C)] 98.5 F (36.9 C) (09/15 0431) Pulse Rate:  [68-119] 119 (09/14 2354) Cardiac Rhythm: Normal sinus rhythm (09/15 0431) Resp:  [13-21] 19 (09/15 0431) BP: (104-164)/(57-87) 164/87 (09/15 0431) SpO2:  [94 %-99 %] 98 % (09/15 0431) Weight:  [84.5 kg] 84.5 kg (09/15 0446)  Intake/Output from previous day: 09/14 0701 - 09/15 0700 In: 120 [P.O.:120] Out: 2250 [Urine:2250]  General appearance: alert, cooperative and no distress Heart: regular rate and rhythm Lungs: clear to auscultation bilaterally Abdomen: soft, non-tender; bowel sounds normal; no masses,  no organomegaly Extremities: edema trace Wound: clean and dry  Lab Results: Recent Labs    12/20/18 0500  WBC 10.5  HGB 8.7*  HCT 25.7*  PLT 122*   BMET:  Recent Labs    12/20/18 0500  NA 137  K 3.7  CL 104  CO2 24  GLUCOSE 112*  BUN 14  CREATININE 1.02*  CALCIUM 8.3*    PT/INR: No results for input(s): LABPROT, INR in the last 72 hours. ABG    Component Value Date/Time   PHART 7.341 (L) 12/17/2018 1801   HCO3 19.9 (L) 12/17/2018 1801   TCO2 21 (L) 12/17/2018 1801   ACIDBASEDEF 5.0 (H) 12/17/2018 1801   O2SAT 99.0 12/17/2018 1801   CBG (last 3)  Recent Labs    12/19/18 1210 12/19/18 1309  GLUCAP 34* 105*    Assessment/Plan: S/P Procedure(s) (LRB): CORONARY ARTERY BYPASS GRAFTING (CABG), ON PUMP, TIMES THREE, USING LEFT INTERNAL MAMMARY ARTERY AND ENDOSCOPICALLY HARVESTED RIGHT  GREATER SAPHENOUS VEIN (N/A) TRANSESOPHAGEAL ECHOCARDIOGRAM (TEE) (N/A)  1. CV- NSR, BP has been labile, Am reading of 164 I suspect is inaccurate- will continue Lopressor as patient tolerates, if BP remains elevated tomorrow, can add low dose ACE/ARB 2. Pulm- no acute issues, off oxygen, continue use of IS 3. Renal- patients weight is trending down, continue Lasix 4. GI- constipation, resolved 5. Dispo- patient stable, isolated elevated BP this morning, likely inaccurate, will monitor, continue low dose BB, continue diuretics, if remains stable for d/c in AM   LOS: 7 days    Erin Barrett 12/22/2018   I have seen and examined the patient and agree with the assessment and plan as outlined.  Rexene Alberts, MD 12/22/2018 9:45 AM

## 2018-12-23 MED ORDER — FUROSEMIDE 40 MG PO TABS: 40.0000 mg | ORAL_TABLET | Freq: Every day | ORAL | 0 refills | Status: DC

## 2018-12-23 MED ORDER — CLOPIDOGREL BISULFATE 75 MG PO TABS: 75.0000 mg | ORAL_TABLET | Freq: Every day | ORAL | 3 refills | Status: AC

## 2018-12-23 MED ORDER — DM-GUAIFENESIN ER 30-600 MG PO TB12: 1.0000 | ORAL_TABLET | Freq: Two times a day (BID) | ORAL | Status: AC | PRN

## 2018-12-23 MED ORDER — OXYCODONE HCL 5 MG PO TABS: 5.0000 mg | ORAL_TABLET | ORAL | 0 refills | Status: DC | PRN

## 2018-12-23 MED ORDER — LISINOPRIL 5 MG PO TABS: 5.0000 mg | ORAL_TABLET | Freq: Every day | ORAL | 11 refills | Status: DC

## 2018-12-23 MED ORDER — ACETAMINOPHEN 325 MG PO TABS: 650.0000 mg | ORAL_TABLET | Freq: Four times a day (QID) | ORAL | Status: DC | PRN

## 2018-12-23 MED ORDER — POTASSIUM CHLORIDE CRYS ER 20 MEQ PO TBCR: 20.0000 meq | EXTENDED_RELEASE_TABLET | Freq: Every day | ORAL | 0 refills | Status: DC

## 2018-12-23 NOTE — Progress Notes (Signed)
Discharge instructions (including medications) discussed with and copy provided to patient/caregiver.  CT sutures removed. Steri strips on. Discharge instructions given and reviewed.

## 2018-12-23 NOTE — Progress Notes (Signed)
Discussed sternal precautions, IS, exercise, diet, smoking cessation, and CRPII. Very receptive. Will refer to [place].   Pt is interested in participating in Virtual Cardiac and Pulmonary Rehab. Pt advised that Virtual Cardiac and Pulmonary Rehab is provided at no cost to the patient. Checklist:  1. Pt has smart device  ie smartphone and/or ipad for downloading an app  Yes 2. Reliable internet/wifi service    Yes 3. Understands how to use their smartphone and navigate within an app.  Yes  Pt verbalized understanding and is in agreement. [name] CES, ACSM 9:14 AM 12/23/2018

## 2018-12-23 NOTE — Plan of Care (Signed)
  Problem: Education: Goal: Knowledge of General Education information will improve Description: Including pain rating scale, medication(s)/side effects and non-pharmacologic comfort measures Outcome: Adequate for Discharge   

## 2018-12-23 NOTE — Progress Notes (Addendum)
      [address]       [place] [address]             [phone]      6 Days Post-Op Procedure(s) (LRB): CORONARY ARTERY BYPASS GRAFTING (CABG), ON PUMP, TIMES THREE, USING LEFT INTERNAL MAMMARY ARTERY AND ENDOSCOPICALLY HARVESTED RIGHT GREATER SAPHENOUS VEIN (N/A) TRANSESOPHAGEAL ECHOCARDIOGRAM (TEE) (N/A)   Subjective:  Patient feels good.  She states she is already feeling better than she did before she came in.  + ambulation  + BM  Objective: Vital signs in last 24 hours: Temp:  [97.9 F (36.6 C)-98.7 F (37.1 C)] 98.4 F (36.9 C) (09/16 0320) Pulse Rate:  [72-93] 72 (09/16 0320) Cardiac Rhythm: Normal sinus rhythm (09/16 0340) Resp:  [18-19] 18 (09/16 0320) BP: (111-147)/(65-90) 136/75 (09/16 0320) SpO2:  [94 %-98 %] 94 % (09/16 0320) Weight:  [82.2 kg] 82.2 kg (09/16 0400)  General appearance: alert, cooperative and no distress Heart: regular rate and rhythm Lungs: clear to auscultation bilaterally Abdomen: soft, non-tender; bowel sounds normal; no masses,  no organomegaly Extremities: edema trace Wound: clean and dry  Lab Results: No results for input(s): WBC, HGB, HCT, PLT in the last 72 hours. BMET: No results for input(s): NA, K, CL, CO2, GLUCOSE, BUN, CREATININE, CALCIUM in the last 72 hours.  PT/INR: No results for input(s): LABPROT, INR in the last 72 hours. ABG    Component Value Date/Time   PHART 7.341 (L) 12/17/2018 1801   HCO3 19.9 (L) 12/17/2018 1801   TCO2 21 (L) 12/17/2018 1801   ACIDBASEDEF 5.0 (H) 12/17/2018 1801   O2SAT 99.0 12/17/2018 1801   CBG (last 3)  No results for input(s): GLUCAP in the last 72 hours.  Assessment/Plan: S/P Procedure(s) (LRB): CORONARY ARTERY BYPASS GRAFTING (CABG), ON PUMP, TIMES THREE, USING LEFT INTERNAL MAMMARY ARTERY AND ENDOSCOPICALLY HARVESTED RIGHT GREATER SAPHENOUS VEIN (N/A) TRANSESOPHAGEAL ECHOCARDIOGRAM (TEE) (N/A)  1. CV- NSR, BP is starting to increasing- will continue Lopressor  at 12.5 mg BID, start low dose ACE 2. Pulm- no acute issues, continue IS 3. Renal- creatinine WNL, weight is at baseline, will taper lasix 4. Dispo- patient stable, will d/c home today   LOS: 8 days    Erin Barrett 12/23/2018   I have seen and examined the patient and agree with the assessment and plan as outlined.  D/C home  Rexene Alberts, MD 12/23/2018 9:11 AM

## 2018-12-24 LAB — BLOOD GAS, ARTERIAL
Acid-base deficit: 3.2 mmol/L — ABNORMAL HIGH (ref 0.0–2.0)
Bicarbonate: 20.6 mmol/L (ref 20.0–28.0)
Drawn by: 347621
O2 Saturation: 97.3 %
Patient temperature: 98.6
pCO2 arterial: 32.9 mmHg (ref 32.0–48.0)
pH, Arterial: 7.413 (ref 7.350–7.450)
pO2, Arterial: 97.7 mmHg (ref 83.0–108.0)

## 2018-12-25 ENCOUNTER — Ambulatory Visit: Payer: Medicare Other | Admitting: Cardiology

## 2018-12-28 ENCOUNTER — Other Ambulatory Visit: Payer: Self-pay

## 2018-12-28 ENCOUNTER — Telehealth: Payer: Self-pay

## 2018-12-28 MED ORDER — TRAMADOL HCL 50 MG PO TABS: 50.0000 mg | ORAL_TABLET | Freq: Four times a day (QID) | ORAL | 0 refills | Status: DC | PRN

## 2018-12-28 NOTE — Telephone Encounter (Signed)
Patient contacted and verified pharmacy.  Prescription for Tramadol 50mg  Q6 hours PRN for pain given.  Patient aware.

## 2018-12-28 NOTE — Telephone Encounter (Signed)
-----   Message from Rexene Alberts, MD sent at 12/28/2018  1:08 PM EDT ----- Regarding: RE: Pain medication refill Let's try tramadol ----- Message ----- From: Donnella Sham, RN Sent: 12/28/2018  12:18 PM EDT To: Rexene Alberts, MD Subject: Pain medication refill                         Hey,  She is requesting a refill on Oxycodone.  Stated she is taking 2 tablets every 6 hours for pain.  Also taking Tylenol, does not feel as if it is helping any.  Can she have a refill? Or Tramadol?  Thanks,  Caryl Pina

## 2019-01-04 ENCOUNTER — Other Ambulatory Visit: Payer: Self-pay | Admitting: Cardiology

## 2019-01-04 DIAGNOSIS — I10 Essential (primary) hypertension: Secondary | ICD-10-CM

## 2019-01-07 ENCOUNTER — Other Ambulatory Visit: Payer: Self-pay | Admitting: Thoracic Surgery (Cardiothoracic Vascular Surgery)

## 2019-01-07 DIAGNOSIS — Z951 Presence of aortocoronary bypass graft: Secondary | ICD-10-CM

## 2019-01-11 ENCOUNTER — Encounter: Payer: Self-pay | Admitting: Physician Assistant

## 2019-01-11 ENCOUNTER — Ambulatory Visit (INDEPENDENT_AMBULATORY_CARE_PROVIDER_SITE_OTHER): Payer: Self-pay | Admitting: Physician Assistant

## 2019-01-11 ENCOUNTER — Other Ambulatory Visit: Payer: Self-pay

## 2019-01-11 ENCOUNTER — Ambulatory Visit
Admission: RE | Admit: 2019-01-11 | Discharge: 2019-01-11 | Disposition: A | Discharge status: Home / Self Care | Payer: Medicare Other | Source: Ambulatory Visit | Attending: Thoracic Surgery (Cardiothoracic Vascular Surgery) | Admitting: Thoracic Surgery (Cardiothoracic Vascular Surgery)

## 2019-01-11 VITALS — BP 120/81 | HR 79 | Temp 97.7°F | Resp 20 | Ht 66.0 in | Wt 182.6 lb

## 2019-01-11 DIAGNOSIS — Z951 Presence of aortocoronary bypass graft: Secondary | ICD-10-CM

## 2019-01-11 NOTE — Progress Notes (Signed)
[address]       [place] [address]             [phone]      Christine Skinner is a 63 y.o. female patient who underwent a coronary bypass grafting x3 on 12/17/2018.  She returns today for her routine follow-up visit.   No diagnosis found. Past Medical History:  Diagnosis Date  . Aortic insufficiency   . Coronary artery disease   . Diabetes mellitus without complication (HCC)   . Hyperlipidemia   . Hypertension   . Hypothyroidism   . 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 Ulcerative colitis (HCC)    No past surgical history pertinent negatives on file. Scheduled Meds: Current Outpatient Medications on File Prior to Visit  Medication Sig Dispense Refill  . acetaminophen (TYLENOL) 325 MG tablet Take 2 tablets (650 mg total) by mouth every 6 (six) hours as needed for mild pain or fever.    Marland Kitchen aspirin EC 81 MG tablet Take 81 mg by mouth daily.    Marland Kitchen atorvastatin (LIPITOR) 40 MG tablet Take 40 mg by mouth daily.     . clopidogrel (PLAVIX) 75 MG tablet Take 1 tablet (75 mg total) by mouth daily. 30 tablet 3  . dextromethorphan-guaiFENesin (MUCINEX DM) 30-600 MG 12hr tablet Take 1 tablet by mouth 2 (two) times daily as needed for cough.    . fluticasone (FLONASE) 50 MCG/ACT nasal spray Place 1 spray into both nostrils daily as needed for allergies.     . furosemide (LASIX) 40 MG tablet Take 1 tablet (40 mg total) by mouth daily. For 5 days 5 tablet 0  . levothyroxine (SYNTHROID) 100 MCG tablet Take 100 mcg by mouth daily before breakfast.     . lisinopril (ZESTRIL) 5 MG tablet Take 1 tablet (5 mg total) by mouth daily. 30 tablet 11  . metFORMIN (GLUCOPHAGE) 500 MG tablet Take 500 mg by mouth daily with breakfast.     . metoprolol succinate (TOPROL-XL) 25 MG 24 hr tablet TAKE 1 TABLET (25 MG TOTAL) BY MOUTH DAILY. TAKE WITH OR IMMEDIATELY FOLLOWING A MEAL. 30 tablet 1  . nitroGLYCERIN (NITROSTAT) 0.4 MG SL tablet  Place 0.4 mg under the tongue every 5 (five) minutes as needed for chest pain.     Marland Kitchen omeprazole (PRILOSEC) 20 MG capsule Take 20 mg by mouth daily.     Marland Kitchen oxyCODONE (OXY IR/ROXICODONE) 5 MG immediate release tablet Take 1-2 tablets (5-10 mg total) by mouth every 4 (four) hours as needed for severe pain. 30 tablet 0  . potassium chloride SA (K-DUR) 20 MEQ tablet Take 1 tablet (20 mEq total) by mouth daily. For 5 days 5 tablet 0  . traMADol (ULTRAM) 50 MG tablet Take 1 tablet (50 mg total) by mouth every 6 (six) hours as needed. 28 tablet 0   No current facility-administered medications on file prior to visit.      Blood pressure 120/81, pulse 79, temperature 97.7 F (36.5 C), temperature source Skin, resp. rate 20, height 5\' 6"  (1.676 m), weight 182 lb 9.6 oz (82.8 kg), SpO2 98 %.  Subjective Christine Skinner presents today for her routine follow-up appointment status post coronary bypass grafting x3 by Dr. Baker Pierini.  Other than some shortness of breath while walking the patient has no other complaints today.  Objective  Cor: Regular rate and rhythm, no  murmur Pulm: Clear to auscultation bilaterally and in all fields Abd: No tenderness Wound: Sternotomy incision is well-healed, right to [facility] sites are well-healed Ext: No edema  Chest xray 01/11/2019  CLINICAL DATA:  Patient status post CABG 12/17/2018. No current complaints.  EXAM: CHEST - 2 VIEW  COMPARISON:  Single-view of the chest 12/20/2018.  FINDINGS: Median sternotomy wires are intact and unchanged. Lungs are clear. Heart size is normal. No pneumothorax or pleural fluid. No acute or focal bony abnormality.  IMPRESSION: No acute disease.   Electronically Signed   By: Inge Rise M.D.   On: 01/11/2019 14:42    Assessment & Plan   Christine Skinner returns today for her routine follow-up appointment status post coronary bypass grafting x3.  Overall she is doing quite well.  She is doing half of a mile walks with  some occasional shortness of breath.  She has not had shortness of breath at rest.  She does still have some tenderness over her incision however, she is only taking Tylenol.  She has stopped all narcotic medications.  I have informed her she may start to drive a vehicle.  She is cautioned to start with someone in the car with her and driving short distances.  We discussed a potential rehab program outpatient which she was not interested in at this time.  If she is to change her mind I would be happy to provide a referral.  She feels she is able to rehab at home.  She is very active and plans to slowly increase her weight limitations with her upper body in the next few weeks.  She has lost 280 pounds over the last 5 years and would like to continue to lose the last 30 pounds that she has gained since COVID.  She plans to do this by controlling her diet and eventually getting back to her gym when it safe.  I suggested increasing her walking distance as tolerated.  I reviewed her chest x-ray with the patient and answered all questions.  There was no evidence of pleural effusion or pneumothorax on x-ray.  All of her incisions were well-healed.  The patient is aware of her cardiology appointment scheduled for 01/15/2019.  Since she was in normal sinus rhythm today in the 70s and her blood pressure was well controlled I did not titrate any of her medications today.  She understands that she can call our office if any new concerns arise but otherwise, she is to follow-up with Dr. Roxy Manns in 2 months.  No medication changes. Follow-up in two months with Dr. Roxy Manns.   Elgie Collard 01/11/2019

## 2019-01-11 NOTE — Patient Instructions (Signed)
Make every effort to stay physically active, get some type of exercise on a regular basis, and stick to a "heart healthy diet".  The long term benefits for regular exercise and a healthy diet are critically important to your overall health and wellbeing.  You are encouraged to enroll and participate in the outpatient cardiac rehab program beginning as soon as practical.  You may return to driving an automobile as long as you are no longer requiring oral narcotic pain relievers during the daytime.  It would be wise to start driving only short distances during the daylight and gradually increase from there as you feel comfortable.  Please follow-up with Dr. Roxy Manns in 2 months.

## 2019-01-14 DIAGNOSIS — Z006 Encounter for examination for normal comparison and control in clinical research program: Secondary | ICD-10-CM

## 2019-01-14 NOTE — Research (Addendum)
[facility] Research Study 30 Day Follow up via telephone   Pt doing well at this time. No complications or AE's.  [id number]  MOBILITY:    I HAVE NO PROBLEMS WALKING [x]   I HAVE SLIGHT PROBLEMS WALKING []   I HAVE MODERATE PROBLEMS WALKING []   I HAVE SEVERE PROBLEMS WALKING []   I AM UNABLE TO WALK  []     SELF-CARE:   I HAVE NO PROBLEMS WASING OR DRESSING MYSELF  [x]   I HAVE SLIGHT PROBLEMS WASHING OR DRESSING MYSELF  []   I HAVE MODERATE PROBLEMS WASHING OR DRESSING MYSELF []   I HAVE SEVERE PROBLEMS WASHING OR DRESSING MYSELF  []   I HAVE SEVERE PROBLEMS WASHING OR DRESSING MYSELF  []   I AM UNABLE TO WASH OR DRESS MYSELF []     USUAL ACTIVITIES: (E.G. WORK/STUDY/HOUSEWORK/FAMILY OR LEISURE ACTIVITIES.    I HAVE NO PROBLEMS DOING MY USUAL ACTIVITIES [x]   I HAVE SLIGHT PROBLEMS DOING MY USUAL ACTIVITIES []   I HAVE MODERATE PROBLEMS DOING MY USUAL ACTIVIITIES []   I HAVE SEVERE PROBLEMS DOING MY USUAL ACTIVITIES []   I AM UNABLE TO DO MY USUAL ACTIVITIES []     PAIN /DISCOMFORT   I HAVE NO PAIN OR DISCOMFORT [x]   I HAVE SLIGHT PAIN OR DISCOMFORT []   I HAVE MODERATE PAIN OR DISCOMFORT []   I HAVE SEVERE PAIN OR DISCOMFORT []   I HAVE EXTREME PAIN OR DISCOMFORT []     ANXIETY/DEPRESSION   I AM NOT ANXIOUS OR DEPRESSED [x]   I AM SLIGHTLY ANXIOUS OR DEPRESSED []   I AM MODERATELY ANXIOUS OR DREPRESSED []   I AM SEVERELY ANXIOUS OR DEPRESSED []   I AM EXTREMELY ANXIOUS OR DEPRESSED []     SCALE OF 0-100 HOW WOULD YOU RATE TODAY?  0 IS THE WORSE AND 100 IS THE BEST HEALTH YOU CAN IMAGINE: 85

## 2019-01-15 ENCOUNTER — Encounter: Payer: Self-pay | Admitting: Cardiology

## 2019-01-15 ENCOUNTER — Ambulatory Visit (INDEPENDENT_AMBULATORY_CARE_PROVIDER_SITE_OTHER): Payer: Medicare Other | Admitting: Cardiology

## 2019-01-15 ENCOUNTER — Other Ambulatory Visit: Payer: Self-pay

## 2019-01-15 VITALS — BP 139/79 | HR 63 | Temp 97.8°F | Ht 66.0 in | Wt 182.8 lb

## 2019-01-15 DIAGNOSIS — I10 Essential (primary) hypertension: Secondary | ICD-10-CM | POA: Diagnosis not present

## 2019-01-15 DIAGNOSIS — Z951 Presence of aortocoronary bypass graft: Secondary | ICD-10-CM

## 2019-01-15 DIAGNOSIS — I2510 Atherosclerotic heart disease of native coronary artery without angina pectoris: Secondary | ICD-10-CM

## 2019-01-15 DIAGNOSIS — E7800 Pure hypercholesterolemia, unspecified: Secondary | ICD-10-CM | POA: Diagnosis not present

## 2019-01-15 NOTE — Progress Notes (Signed)
Primary Physician:  Audley Hose, MD   Patient ID: Christine Skinner, female    DOB: 12-05-1955, 63 y.o.   MRN: [id number]  Subjective:    Chief Complaint  Patient presents with  . Follow-up    CABG  . Hypertension  . Hyperlipidemia    HPI: Christine Skinner  is a 63 y.o. female  with history of type 2 diabetes, hypertension, hyperlipidemia, tobacco use, and that she recently quit, hypothyroidism, ulcerative colitis, due to persistent symptoms of chest pain she underwent coronary angiogram on 12/15/2018 revealing diffuse severe disease. She underwent CABG x3 on 12/17/2018 with Dr. Roxy Manns.  She now presents for follow-up.  She is doing well since her procedure, continues to have incisional chest discomfort and some slight itching to the scar.  No exertional chest pain and feels that her symptoms are improving each day.  She has been walking approximately 1/2 to 1 mile per day.  She has not smoked since August 1. Tolerating medications well.   Past Medical History:  Diagnosis Date  . Aortic insufficiency   . Coronary artery disease   . Diabetes mellitus without complication ([place])   . Hyperlipidemia   . Hypertension   . Hypothyroidism   . 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 Ulcerative colitis [facility])     Past Surgical History:  Procedure Laterality Date  . CARDIAC CATHETERIZATION  12/15/2018  . CORONARY ARTERY BYPASS GRAFT N/A 12/17/2018   Procedure: CORONARY ARTERY BYPASS GRAFTING (CABG), ON PUMP, TIMES THREE, USING LEFT INTERNAL MAMMARY ARTERY AND ENDOSCOPICALLY HARVESTED RIGHT GREATER SAPHENOUS VEIN;  Surgeon: Rexene Alberts, MD;  Location: [place];  Service: Open Heart Surgery;  Laterality: N/A;  . FRACTURE SURGERY Right    thumb  . head surgery Left   . LEFT HEART CATH AND CORONARY ANGIOGRAPHY N/A 12/15/2018   Procedure: LEFT HEART CATH AND CORONARY ANGIOGRAPHY;  Surgeon: Nigel Mormon, MD;  Location: [place] CV LAB;   Service: Cardiovascular;  Laterality: N/A;  . TEE WITHOUT CARDIOVERSION N/A 12/17/2018   Procedure: TRANSESOPHAGEAL ECHOCARDIOGRAM (TEE);  Surgeon: Rexene Alberts, MD;  Location: [place];  Service: Open Heart Surgery;  Laterality: N/A;  . TONSILLECTOMY    . TUBAL LIGATION  02/1993    Social History   Socioeconomic History  . Marital status: Divorced    Spouse name: Not on file  . Number of children: 2  . Years of education: Not on file  . Highest education level: Not on file  Occupational History  . Not on file  Social Needs  . Financial resource strain: Not on file  . Food insecurity    Worry: Not on file    Inability: Not on file  . Transportation needs    Medical: Not on file    Non-medical: Not on file  Tobacco Use  . Smoking status: Former Smoker    Packs/day: 2.00    Years: 41.00    Pack years: 82.00    Types: Cigarettes    Quit date: 11/07/2018    Years since quitting: 0.1  . Smokeless tobacco: Never Used  Substance and Sexual Activity  . Alcohol use: No  . Drug use: No  . Sexual activity: Not on file  Lifestyle  . Physical activity    Days per week: Not on file    Minutes per session: Not on file  . Stress: Not on  file  Relationships  . Social Musicianconnections    Talks on phone: Not on file    Gets together: Not on file    Attends religious service: Not on file    Active member of club or organization: Not on file    Attends meetings of clubs or organizations: Not on file    Relationship status: Not on file  . Intimate partner violence    Fear of current or ex partner: Not on file    Emotionally abused: Not on file    Physically abused: Not on file    Forced sexual activity: Not on file  Other Topics Concern  . Not on file  Social History Narrative  . Not on file    Review of Systems  Constitution: Negative for decreased appetite, malaise/fatigue, weight gain and weight loss.  Eyes: Negative for visual disturbance.  Cardiovascular: Negative for chest  pain (incisional), claudication, dyspnea on exertion, leg swelling, orthopnea, palpitations and syncope.  Respiratory: Negative for hemoptysis and wheezing.   Endocrine: Negative for cold intolerance and heat intolerance.  Hematologic/Lymphatic: Does not bruise/bleed easily.  Skin: Negative for nail changes.  Musculoskeletal: Negative for muscle weakness and myalgias.  Gastrointestinal: Negative for abdominal pain, change in bowel habit, nausea and vomiting.  Neurological: Negative for difficulty with concentration, dizziness, focal weakness and headaches.  Psychiatric/Behavioral: Negative for altered mental status and suicidal ideas.  All other systems reviewed and are negative.     Objective:  Blood pressure 139/79, pulse 63, temperature 97.8 F (36.6 C), height 5\' 6"  (1.676 m), weight 182 lb 12.8 oz (82.9 kg), SpO2 98 %. Body mass index is 29.5 kg/m.    Physical Exam  Constitutional: She is oriented to person, place, and time. She appears well-developed and well-nourished.  HENT:  Head: Normocephalic and atraumatic.  Eyes: Conjunctivae are normal.  Neck: No thyromegaly present.  Cardiovascular: Normal rate, regular rhythm and normal heart sounds. Exam reveals no gallop.  No murmur heard. Pulses:      Carotid pulses are 2+ on the right side and 2+ on the left side.      Femoral pulses are 2+ on the right side and 2+ on the left side.      Dorsalis pedis pulses are 2+ on the right side and 2+ on the left side.       Posterior tibial pulses are 2+ on the right side and 2+ on the left side.  Pulmonary/Chest: She has no wheezes. She has no rales.    Sternotomy scar  Abdominal: She exhibits no mass. There is no abdominal tenderness.  Musculoskeletal:        General: No edema.  Lymphadenopathy:    She has no cervical adenopathy.  Neurological: She is alert and oriented to person, place, and time.  Skin: Skin is warm.  Vitals reviewed.  Radiology: No results found.   Laboratory examination:    CMP Latest Ref Rng & Units 12/20/2018 12/19/2018 12/18/2018  Glucose 70 - 99 mg/dL 161(W112(H) 960(A117(H) 540(J127(H)  BUN 8 - 23 mg/dL 14 17 16   Creatinine 0.44 - 1.00 mg/dL 8.11(B1.02(H) 1.47(W1.24(H) 2.95(A1.35(H)  Sodium 135 - 145 mmol/L 137 134(L) 134(L)  Potassium 3.5 - 5.1 mmol/L 3.7 4.3 4.4  Chloride 98 - 111 mmol/L 104 107 107  CO2 22 - 32 mmol/L 24 20(L) 20(L)  Calcium 8.9 - 10.3 mg/dL 8.3(L) 8.3(L) 8.4(L)  Total Protein 6.5 - 8.1 g/dL - - -  Total Bilirubin 0.3 - 1.2 mg/dL - - -  Alkaline Phos 38 - 126 U/L - - -  AST 15 - 41 U/L - - -  ALT 0 - 44 U/L - - -   CBC Latest Ref Rng & Units 12/20/2018 12/19/2018 12/18/2018  WBC 4.0 - 10.5 K/uL 10.5 8.3 9.1  Hemoglobin 12.0 - 15.0 g/dL 4.0(J) 7.1(L) 8.3(L)  Hematocrit 36.0 - 46.0 % 25.7(L) 22.3(L) 25.5(L)  Platelets 150 - 400 K/uL 122(L) 108(L) 128(L)   Lipid Panel     Component Value Date/Time   CHOL 158 12/10/2016 1446   TRIG 109 12/10/2016 1446   HDL 52 12/10/2016 1446   CHOLHDL 3.0 12/10/2016 1446   LDLCALC 84 12/10/2016 1446   HEMOGLOBIN A1C Lab Results  Component Value Date   HGBA1C 5.6 12/15/2018   MPG 114 12/15/2018   TSH Recent Labs    12/16/18 0839  TSH 1.635    PRN Meds:. Medications Discontinued During This Encounter  Medication Reason  . omeprazole (PRILOSEC) 20 MG capsule Error  . acetaminophen (TYLENOL) 325 MG tablet Error  . lisinopril (ZESTRIL) 5 MG tablet Error  . furosemide (LASIX) 40 MG tablet Error  . oxyCODONE (OXY IR/ROXICODONE) 5 MG immediate release tablet Error  . potassium chloride SA (K-DUR) 20 MEQ tablet Error  . traMADol (ULTRAM) 50 MG tablet Error   Current Meds  Medication Sig  . aspirin EC 81 MG tablet Take 81 mg by mouth daily.  Marland Kitchen atorvastatin (LIPITOR) 40 MG tablet Take 40 mg by mouth daily.   . clopidogrel (PLAVIX) 75 MG tablet Take 1 tablet (75 mg total) by mouth daily.  Marland Kitchen dextromethorphan-guaiFENesin (MUCINEX DM) 30-600 MG 12hr tablet Take 1 tablet by mouth 2 (two) times  daily as needed for cough.  . fluticasone (FLONASE) 50 MCG/ACT nasal spray Place 1 spray into both nostrils daily as needed for allergies.   Marland Kitchen ibuprofen (ADVIL) 200 MG tablet Take 600 mg by mouth every 6 (six) hours as needed.  Marland Kitchen levothyroxine (SYNTHROID) 100 MCG tablet Take 100 mcg by mouth daily before breakfast.   . lisinopril (ZESTRIL) 20 MG tablet Take 1 tablet by mouth every morning.  . metFORMIN (GLUCOPHAGE) 500 MG tablet Take 500 mg by mouth daily with breakfast.   . metoprolol succinate (TOPROL-XL) 25 MG 24 hr tablet TAKE 1 TABLET (25 MG TOTAL) BY MOUTH DAILY. TAKE WITH OR IMMEDIATELY FOLLOWING A MEAL.  . nitroGLYCERIN (NITROSTAT) 0.4 MG SL tablet Place 0.4 mg under the tongue every 5 (five) minutes as needed for chest pain.   Marland Kitchen omeprazole (PRILOSEC) 40 MG capsule Take 1 capsule by mouth daily at 2 PM.    Cardiac Studies:   Lexiscan Myoview stress test 11/16/2018: Lexiscan stress test was performed. Stress EKG is non-diagnostic, as this is pharmacological stress test. SPCT images reveal medium sized, severe intensity, mildly reversible perfusion defect in basal anterior/anteroseptal myocardium; and a small sized, severe intensity, moderately reversible perfusion defect in inferior apical myocardium. Severely dilated LV with stress LVEF 27%.  High risk study.   Echocardiogram 11/24/2018: Normal LV systolic function with EF 59%. Left ventricle cavity is normal in size. Mild to moderate concentric hypertrophy of the left ventricle. Normal global wall motion.  Left atrial cavity is mildly dilated. Thickened interatrial septum. Trileaflet aortic valve with mild aortic valve leaflet calcification. Moderate (Grade III) aortic regurgitation. Mild (Grade I) mitral regurgitation. Mild tricuspid regurgitation. Estimated pulmonary artery systolic pressure is 20 mmHg.           CT scan-10/27/2018- aortic and coronary atherosclerosis Left  adrenal adenoma-2.31.7 cm.  COPD.  Assessment:    Atherosclerosis of native coronary artery of native heart without angina pectoris - Plan: EKG 12-Lead  S/P CABG x 3  Essential hypertension  Hypercholesterolemia  EKG 01/15/2019: Normal sinus rhythm at 61 bpm, left atrial abnormality, left axis deviation, right bundle branch block.  T wave inversion in lateral leads, cannot exclude ischemia.  Recommendations:   Patient is presently doing well without any significant complaints.  Does have incisional chest pain, but will hopefully continue to improve with time.  She is on appropriate medical therapy, will continue the same.  No symptoms of angina.  I have encouraged her to start cardiac rehab, and she is willing to consider this.  Blood pressure is well controlled.  She is to see CV surgery in 2 months, will plan to see her back sometime after this.  She questions if she can use some as needed Mederma to hopefully help with her incision. I have recommended that she hold off on this for now. Incision is healing well, no evidence of infection.   Toniann Fail, MSN, APRN, FNP-C [facility] [place] Office: [phone] Fax: [phone]

## 2019-02-11 ENCOUNTER — Other Ambulatory Visit: Payer: Self-pay | Admitting: Physician Assistant

## 2019-02-12 ENCOUNTER — Other Ambulatory Visit: Payer: Self-pay

## 2019-02-12 DIAGNOSIS — I10 Essential (primary) hypertension: Secondary | ICD-10-CM

## 2019-02-12 MED ORDER — METOPROLOL SUCCINATE ER 25 MG PO TB24: 25.0000 mg | ORAL_TABLET | Freq: Every day | ORAL | 1 refills | Status: AC

## 2019-03-17 ENCOUNTER — Ambulatory Visit: Payer: Medicare Other | Admitting: Cardiology

## 2019-03-17 NOTE — Progress Notes (Deleted)
Primary Physician:  [name], Mobolaji B, MD   Patient ID: Christine Skinner, female    DOB: 03/02/1956, 63 y.o.   MRN: [id number]  Subjective:    No chief complaint on file.   HPI: Christine Skinner  is a 63 y.o. female  with history of type 2 diabetes, hypertension, hyperlipidemia, tobacco use, and that she recently quit, hypothyroidism, ulcerative colitis, due to persistent symptoms of chest pain she underwent coronary angiogram on 12/15/2018 revealing diffuse severe disease. She underwent CABG x3 on 12/17/2018 with Dr. Cornelius Moraswen.  She now presents for follow-up.  She is doing well since her procedure, continues to have incisional chest discomfort and some slight itching to the scar.  No exertional chest pain and feels that her symptoms are improving each day.  She has been walking approximately 1/2 to 1 mile per day.  She has not smoked since August 1. Tolerating medications well.   Past Medical History:  Diagnosis Date  . Aortic insufficiency   . Coronary artery disease   . Diabetes mellitus without complication ([facility])   . Hyperlipidemia   . Hypertension   . Hypothyroidism   . 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 Ulcerative colitis [facility])     Past Surgical History:  Procedure Laterality Date  . CARDIAC CATHETERIZATION  12/15/2018  . CORONARY ARTERY BYPASS GRAFT N/A 12/17/2018   Procedure: CORONARY ARTERY BYPASS GRAFTING (CABG), ON PUMP, TIMES THREE, USING LEFT INTERNAL MAMMARY ARTERY AND ENDOSCOPICALLY HARVESTED RIGHT GREATER SAPHENOUS VEIN;  Surgeon: Purcell Nailswen, Clarence H, MD;  [facility];  Service: Open Heart Surgery;  Laterality: N/A;  . FRACTURE SURGERY Right    thumb  . head surgery Left   . LEFT HEART CATH AND CORONARY ANGIOGRAPHY N/A 12/15/2018   Procedure: LEFT HEART CATH AND CORONARY ANGIOGRAPHY;  Surgeon: Elder NegusPatwardhan, Manish J, MD;  Location: [facility];  Service: Cardiovascular;  Laterality: N/A;  . TEE WITHOUT CARDIOVERSION  N/A 12/17/2018   Procedure: TRANSESOPHAGEAL ECHOCARDIOGRAM (TEE);  Surgeon: Purcell Nailswen, Clarence H, MD;  [facility];  Service: Open Heart Surgery;  Laterality: N/A;  . TONSILLECTOMY    . TUBAL LIGATION  02/1993    Social History   Socioeconomic History  . Marital status: Divorced    Spouse name: Not on file  . Number of children: 2  . Years of education: Not on file  . Highest education level: Not on file  Occupational History  . Not on file  Social Needs  . Financial resource strain: Not on file  . Food insecurity    Worry: Not on file    Inability: Not on file  . Transportation needs    Medical: Not on file    Non-medical: Not on file  Tobacco Use  . Smoking status: Former Smoker    Packs/day: 2.00    Years: 41.00    Pack years: 82.00    Types: Cigarettes    Quit date: 11/07/2018    Years since quitting: 0.3  . Smokeless tobacco: Never Used  Substance and Sexual Activity  . Alcohol use: No  . Drug use: No  . Sexual activity: Not on file  Lifestyle  . Physical activity    Days per week: Not on file    Minutes per session: Not on file  . Stress: Not on file  Relationships  . Social connections    Talks on phone: Not on  file    Gets together: Not on file    Attends religious service: Not on file    Active member of club or organization: Not on file    Attends meetings of clubs or organizations: Not on file    Relationship status: Not on file  . Intimate partner violence    Fear of current or ex partner: Not on file    Emotionally abused: Not on file    Physically abused: Not on file    Forced sexual activity: Not on file  Other Topics Concern  . Not on file  Social History Narrative  . Not on file    Review of Systems  Constitution: Negative for decreased appetite, malaise/fatigue, weight gain and weight loss.  Eyes: Negative for visual disturbance.  Cardiovascular: Negative for chest pain (incisional), claudication, dyspnea on exertion, leg swelling,  orthopnea, palpitations and syncope.  Respiratory: Negative for hemoptysis and wheezing.   Endocrine: Negative for cold intolerance and heat intolerance.  Hematologic/Lymphatic: Does not bruise/bleed easily.  Skin: Negative for nail changes.  Musculoskeletal: Negative for muscle weakness and myalgias.  Gastrointestinal: Negative for abdominal pain, change in bowel habit, nausea and vomiting.  Neurological: Negative for difficulty with concentration, dizziness, focal weakness and headaches.  Psychiatric/Behavioral: Negative for altered mental status and suicidal ideas.  All other systems reviewed and are negative.     Objective:  There were no vitals taken for this visit. There is no height or weight on file to calculate BMI.    Physical Exam  Constitutional: She is oriented to person, place, and time. She appears well-developed and well-nourished.  HENT:  Head: Normocephalic and atraumatic.  Eyes: Conjunctivae are normal.  Neck: No thyromegaly present.  Cardiovascular: Normal rate, regular rhythm and normal heart sounds. Exam reveals no gallop.  No murmur heard. Pulses:      Carotid pulses are 2+ on the right side and 2+ on the left side.      Femoral pulses are 2+ on the right side and 2+ on the left side.      Dorsalis pedis pulses are 2+ on the right side and 2+ on the left side.       Posterior tibial pulses are 2+ on the right side and 2+ on the left side.  Pulmonary/Chest: She has no wheezes. She has no rales.  Sternotomy scar  Abdominal: She exhibits no mass. There is no abdominal tenderness.  Musculoskeletal:        General: No edema.  Lymphadenopathy:    She has no cervical adenopathy.  Neurological: She is alert and oriented to person, place, and time.  Skin: Skin is warm.  Vitals reviewed.  Radiology: No results found.  Laboratory examination:    CMP Latest Ref Rng & Units 12/20/2018 12/19/2018 12/18/2018  Glucose 70 - 99 mg/dL 294(T) 654(Y) 503(T)  BUN 8 - 23  mg/dL 14 17 16   Creatinine 0.44 - 1.00 mg/dL ) 4.65(K) 8.12(X)  Sodium 135 - 145 mmol/L 137 134(L) 134(L)  Potassium 3.5 - 5.1 mmol/L 3.7 4.3 4.4  Chloride 98 - 111 mmol/L 104 107 107  CO2 22 - 32 mmol/L 24 20(L) 20(L)  Calcium 8.9 - 10.3 mg/dL 8.3(L) 8.3(L) 8.4(L)  Total Protein 6.5 - 8.1 g/dL - - -  Total Bilirubin 0.3 - 1.2 mg/dL - - -  Alkaline Phos 38 - 126 U/L - - -  AST 15 - 41 U/L - - -  ALT 0 - 44 U/L - - -  CBC Latest Ref Rng & Units 12/20/2018 12/19/2018 12/18/2018  WBC 4.0 - 10.5 K/uL 10.5 8.3 9.1  Hemoglobin 12.0 - 15.0 g/dL 8.7(L) 7.1(L) 8.3(L)  Hematocrit 36.0 - 46.0 % 25.7(L) 22.3(L) 25.5(L)  Platelets 150 - 400 K/uL 122(L) 108(L) 128(L)   Lipid Panel     Component Value Date/Time   CHOL 158 12/10/2016 1446   TRIG 109 12/10/2016 1446   HDL 52 12/10/2016 1446   CHOLHDL 3.0 12/10/2016 1446   LDLCALC 84 12/10/2016 1446   HEMOGLOBIN A1C Lab Results  Component Value Date   HGBA1C 5.6 12/15/2018   MPG 114 12/15/2018   TSH Recent Labs    12/16/18 0839  TSH 1.635    PRN Meds:. There are no discontinued medications. No outpatient medications have been marked as taking for the 03/17/19 encounter (Appointment) with Miquel Dunn, NP.    Cardiac Studies:   Lexiscan Myoview stress test 11/16/2018: Lexiscan stress test was performed. Stress EKG is non-diagnostic, as this is pharmacological stress test. SPCT images reveal medium sized, severe intensity, mildly reversible perfusion defect in basal anterior/anteroseptal myocardium; and a small sized, severe intensity, moderately reversible perfusion defect in inferior apical myocardium. Severely dilated LV with stress LVEF 27%.  High risk study.   Echocardiogram 11/24/2018: Normal LV systolic function with EF 59%. Left ventricle cavity is normal in size. Mild to moderate concentric hypertrophy of the left ventricle. Normal global wall motion.  Left atrial cavity is mildly dilated. Thickened  interatrial septum. Trileaflet aortic valve with mild aortic valve leaflet calcification. Moderate (Grade III) aortic regurgitation. Mild (Grade I) mitral regurgitation. Mild tricuspid regurgitation. Estimated pulmonary artery systolic pressure is 20 mmHg.           CT scan-10/27/2018- aortic and coronary atherosclerosis Left adrenal adenoma-2.31.7 cm.  COPD.  Assessment:   No diagnosis found.  EKG 01/15/2019: Normal sinus rhythm at 61 bpm, left atrial abnormality, left axis deviation, right bundle branch block.  T wave inversion in lateral leads, cannot exclude ischemia.  Recommendations:   Patient is presently doing well without any significant complaints.  Does have incisional chest pain, but will hopefully continue to improve with time.  She is on appropriate medical therapy, will continue the same.  No symptoms of angina.  I have encouraged her to start cardiac rehab, and she is willing to consider this.  Blood pressure is well controlled.  She is to see CV surgery in 2 months, will plan to see her back sometime after this.  She questions if she can use some as needed Mederma to hopefully help with her incision. I have recommended that she hold off on this for now. Incision is healing well, no evidence of infection.   Miquel Dunn, MSN, APRN, FNP-C [facility]. [place] Office: [phone] Fax: [phone]

## 2019-03-19 DIAGNOSIS — Z006 Encounter for examination for normal comparison and control in clinical research program: Secondary | ICD-10-CM

## 2019-03-19 NOTE — Research (Signed)
[place] 66 Day telephone Visit  Patient doing well at this time. No adverse events.   Current Outpatient Medications:  .  aspirin EC 81 MG tablet, Take 81 mg by mouth daily., Disp: , Rfl:  .  atorvastatin (LIPITOR) 40 MG tablet, Take 40 mg by mouth daily. , Disp: , Rfl:  .  clopidogrel (PLAVIX) 75 MG tablet, Take 1 tablet (75 mg total) by mouth daily., Disp: 30 tablet, Rfl: 3 .  dextromethorphan-guaiFENesin (MUCINEX DM) 30-600 MG 12hr tablet, Take 1 tablet by mouth 2 (two) times daily as needed for cough., Disp:  , Rfl:  .  fluticasone (FLONASE) 50 MCG/ACT nasal spray, Place 1 spray into both nostrils daily as needed for allergies. , Disp: , Rfl:  .  ibuprofen (ADVIL) 200 MG tablet, Take 600 mg by mouth every 6 (six) hours as needed., Disp: , Rfl:  .  levothyroxine (SYNTHROID) 100 MCG tablet, Take 100 mcg by mouth daily before breakfast. , Disp: , Rfl:  .  lisinopril (ZESTRIL) 20 MG tablet, Take 1 tablet by mouth every morning., Disp: , Rfl:  .  metFORMIN (GLUCOPHAGE) 500 MG tablet, Take 500 mg by mouth daily with breakfast. , Disp: , Rfl:  .  metoprolol succinate (TOPROL-XL) 25 MG 24 hr tablet, Take 1 tablet (25 mg total) by mouth daily. Take with or immediately following a meal., Disp: 90 tablet, Rfl: 1 .  nitroGLYCERIN (NITROSTAT) 0.4 MG SL tablet, Place 0.4 mg under the tongue every 5 (five) minutes as needed for chest pain. , Disp: , Rfl:  .  omeprazole (PRILOSEC) 40 MG capsule, Take 1 capsule by mouth daily at 2 PM., Disp: , Rfl:    [id number]  MOBILITY:    I HAVE NO PROBLEMS WALKING [x]   I HAVE SLIGHT PROBLEMS WALKING []   I HAVE MODERATE PROBLEMS WALKING []   I HAVE SEVERE PROBLEMS WALKING []   I AM UNABLE TO WALK  []     SELF-CARE:   I HAVE NO PROBLEMS WASHING OR DRESSING MYSELF  [x]   I HAVE SLIGHT PROBLEMS WASHING OR DRESSING MYSELF  []   I HAVE MODERATE PROBLEMS WASHING OR DRESSING MYSELF []   I HAVE SEVERE PROBLEMS WASHING OR DRESSING MYSELF  []   I HAVE SEVERE PROBLEMS WASHING  OR DRESSING MYSELF  []   I AM UNABLE TO [place] OR DRESS MYSELF []     USUAL ACTIVITIES: (E.G. WORK/STUDY/HOUSEWORK/FAMILY OR LEISURE ACTIVITIES.    I HAVE NO PROBLEMS DOING MY USUAL ACTIVITIES []   I HAVE SLIGHT PROBLEMS DOING MY USUAL ACTIVITIES [x]   I HAVE MODERATE PROBLEMS DOING MY USUAL ACTIVIITIES []   I HAVE SEVERE PROBLEMS DOING MY USUAL ACTIVITIES []   I AM UNABLE TO DO MY USUAL ACTIVITIES []     PAIN /DISCOMFORT   I HAVE NO PAIN OR DISCOMFORT [x]   I HAVE SLIGHT PAIN OR DISCOMFORT []   I HAVE MODERATE PAIN OR DISCOMFORT []   I HAVE SEVERE PAIN OR DISCOMFORT []   I HAVE EXTREME PAIN OR DISCOMFORT []     ANXIETY/DEPRESSION   I AM NOT ANXIOUS OR DEPRESSED []   I AM SLIGHTLY ANXIOUS OR DEPRESSED [x]   I AM MODERATELY ANXIOUS OR DREPRESSED []   I AM SEVERELY ANXIOUS OR DEPRESSED []   I AM EXTREMELY ANXIOUS OR DEPRESSED []     SCALE OF 0-100 HOW WOULD YOU RATE TODAY?  0 IS THE WORSE AND 100 IS THE BEST HEALTH YOU CAN IMAGINE: 80

## 2019-03-20 ENCOUNTER — Other Ambulatory Visit: Payer: Self-pay | Admitting: Physician Assistant

## 2019-03-22 ENCOUNTER — Ambulatory Visit: Payer: Medicare Other | Admitting: Thoracic Surgery (Cardiothoracic Vascular Surgery)

## 2019-03-23 ENCOUNTER — Telehealth (INDEPENDENT_AMBULATORY_CARE_PROVIDER_SITE_OTHER): Payer: Medicare Other | Admitting: Thoracic Surgery (Cardiothoracic Vascular Surgery)

## 2019-03-23 ENCOUNTER — Other Ambulatory Visit: Payer: Self-pay

## 2019-03-23 DIAGNOSIS — Z951 Presence of aortocoronary bypass graft: Secondary | ICD-10-CM | POA: Diagnosis not present

## 2019-03-23 NOTE — Progress Notes (Signed)
[address]       [place] [address]             [phone]     CARDIOTHORACIC SURGERY TELEPHONE VIRTUAL OFFICE NOTE  Referring Provider is Patwardhan, Anabel Bene, MD Primary Cardiologist is Yates Decamp, MD PCP is [place], MD   HPI:  I spoke with Christine Skinner (DOB 1955-11-24 ) via telephone on 03/23/2019 at 12:01 PM and verified that I was speaking with the correct person using more than one form of identification.  We discussed the reason(s) for conducting our visit virtually instead of in-person.  The patient expressed understanding the circumstances and agreed to proceed as described.  Patient is a 63 year old female with history of coronary artery disease, hypertension, type 2 diabetes mellitus, hypothyroidism, longstanding tobacco abuse, and complex psychiatric illness who underwent coronary artery bypass grafting x3 on December 17, 2018 for severe three-vessel coronary artery disease with unstable angina pectoris.  The patient's postoperative convalescence was uncomplicated and she was last seen here in our office on January 11, 2019 at which time she was recovering uneventfully.  I spoke with patient over the telephone today to check to see how she is doing.  She reports that overall she is doing very well.  Her biggest complaint is that she feels somewhat depressed, primarily because of the COVID-19 pandemic.  She states that physically she feels quite well and she reports feeling better than she did prior to surgery.  She has not had any chest pain or chest tightness.  She has no shortness of breath.  Appetite is good.  She is walking some although not as much as she would like.  She has no significant pain in her chest although she does have some numbness along the left side of the chest related to her sternotomy incision.  Overall she feels well.    Current Outpatient Medications  Medication Sig Dispense Refill  . aspirin EC 81 MG tablet Take  81 mg by mouth daily.    Marland Kitchen atorvastatin (LIPITOR) 40 MG tablet Take 40 mg by mouth daily.     . clopidogrel (PLAVIX) 75 MG tablet Take 1 tablet (75 mg total) by mouth daily. 30 tablet 3  . dextromethorphan-guaiFENesin (MUCINEX DM) 30-600 MG 12hr tablet Take 1 tablet by mouth 2 (two) times daily as needed for cough.    . fluticasone (FLONASE) 50 MCG/ACT nasal spray Place 1 spray into both nostrils daily as needed for allergies.     Marland Kitchen ibuprofen (ADVIL) 200 MG tablet Take 600 mg by mouth every 6 (six) hours as needed.    Marland Kitchen levothyroxine (SYNTHROID) 100 MCG tablet Take 100 mcg by mouth daily before breakfast.     . lisinopril (ZESTRIL) 20 MG tablet Take 1 tablet by mouth every morning.    . metFORMIN (GLUCOPHAGE) 500 MG tablet Take 500 mg by mouth daily with breakfast.     . metoprolol succinate (TOPROL-XL) 25 MG 24 hr tablet Take 1 tablet (25 mg total) by mouth daily. Take with or immediately following a meal. 90 tablet 1  . nitroGLYCERIN (NITROSTAT) 0.4 MG SL tablet Place 0.4 mg under the tongue every 5 (five) minutes as needed for chest pain.     Marland Kitchen omeprazole (PRILOSEC) 40 MG capsule Take 1 capsule by mouth daily at 2 PM.     No current facility-administered medications for this visit.     Diagnostic Tests:  n/a   Impression:  Patient is doing well approximately 3 months status post coronary artery bypass grafting  Plan:  We have not recommended any changes to the patient's current medications.  I have encouraged the patient to continue to gradually increase her physical activity without any particular limitations.  We discussed the importance of keeping her diabetes under control and close long-term follow-up with her primary cardiologist, Dr. Einar Gip.  All her questions have been addressed.  The patient will return to our office in the future as needed.    I discussed limitations of evaluation and management via telephone.  The patient was advised to call back for repeat telephone  consultation or to seek an in-person evaluation if questions arise or the patient's clinical condition changes in any significant manner.  I spent in excess of 5 minutes of non-face-to-face time during the conduct of this telephone virtual office consultation.    Valentina Gu. Roxy Manns, MD 03/23/2019 12:01 PM

## 2019-03-23 NOTE — Patient Instructions (Signed)
Continue all previous medications without any changes at this time  You may resume unrestricted physical activity without any particular limitations at this time.  Make every effort to keep your diabetes under very tight control.  Follow up closely with your primary care physician or endocrinologist and strive to keep their hemoglobin A1c levels as low as possible, preferably near or below 6.0.  The long term benefits of strict control of diabetes are far reaching and critically important for your overall health and survival.  

## 2019-04-14 ENCOUNTER — Other Ambulatory Visit: Payer: Self-pay | Admitting: Physician Assistant

## 2019-06-28 ENCOUNTER — Other Ambulatory Visit: Payer: Self-pay | Admitting: Internal Medicine

## 2019-06-28 DIAGNOSIS — Z1231 Encounter for screening mammogram for malignant neoplasm of breast: Secondary | ICD-10-CM

## 2019-07-05 ENCOUNTER — Telehealth: Payer: Self-pay | Admitting: Radiology

## 2019-07-05 ENCOUNTER — Encounter: Payer: Self-pay | Admitting: Radiology

## 2019-07-05 NOTE — Telephone Encounter (Signed)
Received referral and medical records [facility] in [place]- primary referral source was for [place]- Dr Erin Fulling.  Left message for patient to call [facility] to see if patient is interested in [facility] office due to patient living in [place].

## 2019-07-08 ENCOUNTER — Other Ambulatory Visit: Payer: Self-pay | Admitting: Internal Medicine

## 2019-07-08 DIAGNOSIS — Z122 Encounter for screening for malignant neoplasm of respiratory organs: Secondary | ICD-10-CM

## 2019-07-12 ENCOUNTER — Telehealth: Payer: Self-pay | Admitting: Radiology

## 2019-07-12 NOTE — Telephone Encounter (Signed)
Left message for patient to call [facility] to schedule appointment from referral from [facility] PCP

## 2019-07-21 ENCOUNTER — Other Ambulatory Visit (HOSPITAL_COMMUNITY): Payer: Self-pay | Admitting: Internal Medicine

## 2019-07-21 DIAGNOSIS — M79604 Pain in right leg: Secondary | ICD-10-CM

## 2019-07-22 ENCOUNTER — Ambulatory Visit (HOSPITAL_COMMUNITY): Admission: RE | Admit: 2019-07-22 | Payer: Medicare Other | Source: Ambulatory Visit

## 2019-09-20 ENCOUNTER — Ambulatory Visit (INDEPENDENT_AMBULATORY_CARE_PROVIDER_SITE_OTHER): Payer: Medicare Other | Admitting: Obstetrics and Gynecology

## 2019-09-20 ENCOUNTER — Other Ambulatory Visit (HOSPITAL_COMMUNITY)
Admission: RE | Admit: 2019-09-20 | Discharge: 2019-09-20 | Disposition: A | Discharge status: Home / Self Care | Payer: Medicare Other | Source: Ambulatory Visit | Attending: Obstetrics and Gynecology | Admitting: Obstetrics and Gynecology

## 2019-09-20 ENCOUNTER — Other Ambulatory Visit: Payer: Self-pay

## 2019-09-20 ENCOUNTER — Encounter: Payer: Self-pay | Admitting: Obstetrics and Gynecology

## 2019-09-20 VITALS — BP 128/74 | HR 59 | Ht 66.0 in | Wt 218.6 lb

## 2019-09-20 DIAGNOSIS — R87612 Low grade squamous intraepithelial lesion on cytologic smear of cervix (LGSIL): Secondary | ICD-10-CM | POA: Diagnosis not present

## 2019-09-20 DIAGNOSIS — Z01419 Encounter for gynecological examination (general) (routine) without abnormal findings: Secondary | ICD-10-CM | POA: Diagnosis present

## 2019-09-20 DIAGNOSIS — Z780 Asymptomatic menopausal state: Secondary | ICD-10-CM | POA: Diagnosis not present

## 2019-09-20 DIAGNOSIS — Z1231 Encounter for screening mammogram for malignant neoplasm of breast: Secondary | ICD-10-CM | POA: Diagnosis not present

## 2019-09-20 DIAGNOSIS — Z0000 Encounter for general adult medical examination without abnormal findings: Secondary | ICD-10-CM | POA: Diagnosis not present

## 2019-09-20 DIAGNOSIS — Z1151 Encounter for screening for human papillomavirus (HPV): Secondary | ICD-10-CM | POA: Insufficient documentation

## 2019-09-20 DIAGNOSIS — Z1239 Encounter for other screening for malignant neoplasm of breast: Secondary | ICD-10-CM

## 2019-09-20 NOTE — Patient Instructions (Signed)
Health Maintenance, Female Adopting a healthy lifestyle and getting preventive care are important in promoting health and wellness. Ask your health care provider about:  The right schedule for you to have regular tests and exams.  Things you can do on your own to prevent diseases and keep yourself healthy. What should I know about diet, weight, and exercise? Eat a healthy diet   Eat a diet that includes plenty of vegetables, fruits, low-fat dairy products, and lean protein.  Do not eat a lot of foods that are high in solid fats, added sugars, or sodium. Maintain a healthy weight Body mass index (BMI) is used to identify weight problems. It estimates body fat based on height and weight. Your health care provider can help determine your BMI and help you achieve or maintain a healthy weight. Get regular exercise Get regular exercise. This is one of the most important things you can do for your health. Most adults should:  Exercise for at least 150 minutes each week. The exercise should increase your heart rate and make you sweat (moderate-intensity exercise).  Do strengthening exercises at least twice a week. This is in addition to the moderate-intensity exercise.  Spend less time sitting. Even light physical activity can be beneficial. Watch cholesterol and blood lipids Have your blood tested for lipids and cholesterol at 62 years of age, then have this test every 5 years. Have your cholesterol levels checked more often if:  Your lipid or cholesterol levels are high.  You are older than 62 years of age.  You are at high risk for heart disease. What should I know about cancer screening? Depending on your health history and family history, you may need to have cancer screening at various ages. This may include screening for:  Breast cancer.  Cervical cancer.  Colorectal cancer.  Skin cancer.  Lung cancer. What should I know about heart disease, diabetes, and high blood  pressure? Blood pressure and heart disease  High blood pressure causes heart disease and increases the risk of stroke. This is more likely to develop in people who have high blood pressure readings, are of African descent, or are overweight.  Have your blood pressure checked: ? Every 3-5 years if you are 18-39 years of age. ? Every year if you are 40 years old or older. Diabetes Have regular diabetes screenings. This checks your fasting blood sugar level. Have the screening done:  Once every three years after age 40 if you are at a normal weight and have a low risk for diabetes.  More often and at a younger age if you are overweight or have a high risk for diabetes. What should I know about preventing infection? Hepatitis B If you have a higher risk for hepatitis B, you should be screened for this virus. Talk with your health care provider to find out if you are at risk for hepatitis B infection. Hepatitis C Testing is recommended for:  Everyone born from 1945 through 1965.  Anyone with known risk factors for hepatitis C. Sexually transmitted infections (STIs)  Get screened for STIs, including gonorrhea and chlamydia, if: ? You are sexually active and are younger than 62 years of age. ? You are older than 62 years of age and your health care provider tells you that you are at risk for this type of infection. ? Your sexual activity has changed since you were last screened, and you are at increased risk for chlamydia or gonorrhea. Ask your health care provider if   you are at risk.  Ask your health care provider about whether you are at high risk for HIV. Your health care provider may recommend a prescription medicine to help prevent HIV infection. If you choose to take medicine to prevent HIV, you should first get tested for HIV. You should then be tested every 3 months for as long as you are taking the medicine. Pregnancy  If you are about to stop having your period (premenopausal) and  you may become pregnant, seek counseling before you get pregnant.  Take 400 to 800 micrograms (mcg) of folic acid every day if you become pregnant.  Ask for birth control (contraception) if you want to prevent pregnancy. Osteoporosis and menopause Osteoporosis is a disease in which the bones lose minerals and strength with aging. This can result in bone fractures. If you are 65 years old or older, or if you are at risk for osteoporosis and fractures, ask your health care provider if you should:  Be screened for bone loss.  Take a calcium or vitamin D supplement to lower your risk of fractures.  Be given hormone replacement therapy (HRT) to treat symptoms of menopause. Follow these instructions at home: Lifestyle  Do not use any products that contain nicotine or tobacco, such as cigarettes, e-cigarettes, and chewing tobacco. If you need help quitting, ask your health care provider.  Do not use street drugs.  Do not share needles.  Ask your health care provider for help if you need support or information about quitting drugs. Alcohol use  Do not drink alcohol if: ? Your health care provider tells you not to drink. ? You are pregnant, may be pregnant, or are planning to become pregnant.  If you drink alcohol: ? Limit how much you use to 0-1 drink a day. ? Limit intake if you are breastfeeding.  Be aware of how much alcohol is in your drink. In the [place] one drink equals one 12 oz bottle of beer (355 mL), one 5 oz glass of wine (148 mL), or one 1 oz glass of hard liquor (44 mL). General instructions  Schedule regular health, dental, and eye exams.  Stay current with your vaccines.  Tell your health care provider if: ? You often feel depressed. ? You have ever been abused or do not feel safe at home. Summary  Adopting a healthy lifestyle and getting preventive care are important in promoting health and wellness.  Follow your health care provider's instructions about healthy  diet, exercising, and getting tested or screened for diseases.  Follow your health care provider's instructions on monitoring your cholesterol and blood pressure. This information is not intended to replace advice given to you by your health care provider. Make sure you discuss any questions you have with your health care provider. Document Revised: 03/18/2018 Document Reviewed: 03/18/2018 Elsevier Patient Education  2020 Elsevier Inc.  

## 2019-09-20 NOTE — Progress Notes (Signed)
Christine Skinner is a 64 y.o. G30P0 female here for a routine annual gynecologic exam.  Current complaints: None.   Denies abnormal vaginal bleeding, discharge, pelvic pain, not sexual active at present or other gynecologic concerns.  Denies menopausal Sx. Chronic medical problems managed by PCP   Gynecologic History No LMP recorded. Patient is postmenopausal. Contraception: tubal ligation Last Pap: 2018. Results were: normal Last mammogram: Uncertain. Results were: normal  Obstetric History OB History  Gravida Para Term Preterm AB Living  2         2  SAB TAB Ectopic Multiple Live Births          2    # Outcome Date GA Lbr Len/2nd Weight Sex Delivery Anes PTL Lv  2 Gravida           1 Gravida             Past Medical History:  Diagnosis Date  . Aortic insufficiency   . Coronary artery disease   . Diabetes mellitus without complication (HCC)   . Hyperlipidemia   . Hypertension   . Hypothyroidism   . 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 Ulcerative colitis [facility])     Past Surgical History:  Procedure Laterality Date  . CARDIAC CATHETERIZATION  12/15/2018  . CORONARY ARTERY BYPASS GRAFT N/A 12/17/2018   Procedure: CORONARY ARTERY BYPASS GRAFTING (CABG), ON PUMP, TIMES THREE, USING LEFT INTERNAL MAMMARY ARTERY AND ENDOSCOPICALLY HARVESTED RIGHT GREATER SAPHENOUS VEIN;  Surgeon: Purcell Nails, MD;  Location: [facility] OR;  Service: Open Heart Surgery;  Laterality: N/A;  . FRACTURE SURGERY Right    thumb  . head surgery Left   . LEFT HEART CATH AND CORONARY ANGIOGRAPHY N/A 12/15/2018   Procedure: LEFT HEART CATH AND CORONARY ANGIOGRAPHY;  Surgeon: Elder Negus, MD;  Location: [facility] INVASIVE CV LAB;  Service: Cardiovascular;  Laterality: N/A;  . TEE WITHOUT CARDIOVERSION N/A 12/17/2018   Procedure: TRANSESOPHAGEAL ECHOCARDIOGRAM (TEE);  Surgeon: Purcell Nails, MD;  Location: [facility] OR;  Service: Open Heart Surgery;  Laterality: N/A;  .  TONSILLECTOMY    . TUBAL LIGATION  02/1993    Current Outpatient Medications on File Prior to Visit  Medication Sig Dispense Refill  . aspirin EC 81 MG tablet Take 81 mg by mouth daily.    Marland Kitchen atorvastatin (LIPITOR) 40 MG tablet Take 40 mg by mouth daily.     . clopidogrel (PLAVIX) 75 MG tablet Take 1 tablet (75 mg total) by mouth daily. 30 tablet 3  . dextromethorphan-guaiFENesin (MUCINEX DM) 30-600 MG 12hr tablet Take 1 tablet by mouth 2 (two) times daily as needed for cough.    . fluticasone (FLONASE) 50 MCG/ACT nasal spray Place 1 spray into both nostrils daily as needed for allergies.     Marland Kitchen ibuprofen (ADVIL) 200 MG tablet Take 600 mg by mouth every 6 (six) hours as needed.    Marland Kitchen levothyroxine (SYNTHROID) 100 MCG tablet Take 100 mcg by mouth daily before breakfast.     . lisinopril (ZESTRIL) 20 MG tablet Take 1 tablet by mouth every morning.    . metFORMIN (GLUCOPHAGE) 500 MG tablet Take 500 mg by mouth daily with breakfast.     . omeprazole (PRILOSEC) 40 MG capsule Take 1 capsule by mouth daily at 2 PM.    . metoprolol succinate (TOPROL-XL) 25 MG 24 hr tablet Take 1 tablet (25 mg total) by  mouth daily. Take with or immediately following a meal. 90 tablet 1  . nitroGLYCERIN (NITROSTAT) 0.4 MG SL tablet Place 0.4 mg under the tongue every 5 (five) minutes as needed for chest pain.  (Patient not taking: Reported on 09/20/2019)     No current facility-administered medications on file prior to visit.    No Known Allergies  Social History   Socioeconomic History  . Marital status: Divorced    Spouse name: Not on file  . Number of children: 2  . Years of education: Not on file  . Highest education level: Not on file  Occupational History  . Not on file  Tobacco Use  . Smoking status: Former Smoker    Packs/day: 2.00    Years: 41.00    Pack years: 82.00    Types: Cigarettes    Quit date: 11/07/2018    Years since quitting: 0.8  . Smokeless tobacco: Never Used  Vaping Use  . Vaping  Use: Former  . Substances: Nicotine  Substance and Sexual Activity  . Alcohol use: No  . Drug use: No  . Sexual activity: Not on file  Other Topics Concern  . Not on file  Social History Narrative  . Not on file   Social Determinants of Health   Financial Resource Strain:   . Difficulty of Paying Living Expenses:   Food Insecurity:   . Worried About Programme researcher, broadcasting/film/video in the Last Year:   . Barista in the Last Year:   Transportation Needs:   . Freight forwarder (Medical):   Marland Kitchen Lack of Transportation (Non-Medical):   Physical Activity:   . Days of Exercise per Week:   . Minutes of Exercise per Session:   Stress:   . Feeling of Stress :   Social Connections:   . Frequency of Communication with Friends and Family:   . Frequency of Social Gatherings with Friends and Family:   . Attends Religious Services:   . Active Member of Clubs or Organizations:   . Attends Banker Meetings:   Marland Kitchen Marital Status:   Intimate Partner Violence:   . Fear of Current or Ex-Partner:   . Emotionally Abused:   Marland Kitchen Physically Abused:   . Sexually Abused:     Family History  Problem Relation Age of Onset  . Cancer Mother        colon and bone  . Hypertension Father   . Heart failure Father   . Thyroid disease Father   . Thyroid disease Brother     The following portions of the patient's history were reviewed and updated as appropriate: allergies, current medications, past family history, past medical history, past social history, past surgical history and problem list.  Review of Systems Pertinent items noted in HPI and remainder of comprehensive ROS otherwise negative.   Objective:  BP 128/74   Pulse (!) 59   Ht 5\' 6"  (1.676 m)   Wt 218 lb 9.6 oz (99.2 kg)   BMI 35.28 kg/m  CONSTITUTIONAL: Well-developed, well-nourished female in no acute distress.  HENT:  Normocephalic, atraumatic, External right and left ear normal. Oropharynx is clear and moist EYES:  Conjunctivae and EOM are normal. Pupils are equal, round, and reactive to light. No scleral icterus.  NECK: Normal range of motion, supple, no masses.  Normal thyroid.  SKIN: Skin is warm and dry. No rash noted. Not diaphoretic. No erythema. No pallor. NEUROLGIC: Alert and oriented to person, place, and time.  Normal reflexes, muscle tone coordination. No cranial nerve deficit noted. PSYCHIATRIC: Normal mood and affect. Normal behavior. Normal judgment and thought content. CARDIOVASCULAR: Normal heart rate noted, regular rhythm RESPIRATORY: Clear to auscultation bilaterally. Effort and breath sounds normal, no problems with respiration noted. BREASTS: Symmetric in size. No masses, skin changes, nipple drainage, or lymphadenopathy. ABDOMEN: Soft, normal bowel sounds, no distention noted.  No tenderness, rebound or guarding.  PELVIC: Normal appearing external genitalia; normal appearing vaginal mucosa and cervix.  No abnormal discharge noted.  Pap smear obtained.  Normal uterine size, no other palpable masses, no uterine or adnexal tenderness. MUSCULOSKELETAL: Normal range of motion. No tenderness.  No cyanosis, clubbing, or edema.  2+ distal pulses.   Assessment:  Annual gynecologic examination with pap smear   Plan:  Will follow up results of pap smear and manage accordingly. Mammogram scheduled Routine preventative health maintenance measures emphasized. Please refer to After Visit Summary for other counseling recommendations.    Chancy Milroy, MD, [place] Attending [place] for [facility] [place]

## 2019-09-23 LAB — CYTOLOGY - PAP
Comment: NEGATIVE
Comment: NEGATIVE
Comment: NEGATIVE
HPV 16: NEGATIVE
HPV 18 / 45: NEGATIVE
High risk HPV: POSITIVE — AB

## 2019-09-24 ENCOUNTER — Encounter: Payer: Self-pay | Admitting: Obstetrics and Gynecology

## 2019-09-24 DIAGNOSIS — R87612 Low grade squamous intraepithelial lesion on cytologic smear of cervix (LGSIL): Secondary | ICD-10-CM | POA: Insufficient documentation

## 2019-09-27 ENCOUNTER — Telehealth: Payer: Self-pay | Admitting: *Deleted

## 2019-09-27 NOTE — Telephone Encounter (Signed)
I called Christine Skinner and left a message I am calling with some results and to discuss appointment. Please call our office back. ( plan is for staff to call once more, if no answer send MyChart message and notify registrar to schedule colpo and notify pt) Alayiah Fontes,RN

## 2019-09-27 NOTE — Telephone Encounter (Signed)
-----   Message from Hermina Staggers, MD sent at 09/24/2019  9:02 AM EDT ----- Please let Christine Skinner know that her pap smear is normal and that she needs a colposcopy.  Thanks Casimiro Needle

## 2019-09-30 NOTE — Telephone Encounter (Signed)
Called patient to inform her of +HPV and ASCUS on Pap and that Colpo needs to be scheduled. Asked patient to call the office for results and recommendation for follow up.   Message to front office to schedule patient for Colposcopy.

## 2019-10-25 ENCOUNTER — Ambulatory Visit
Admission: RE | Admit: 2019-10-25 | Discharge: 2019-10-25 | Disposition: A | Discharge status: Home / Self Care | Payer: Medicare Other | Source: Ambulatory Visit | Attending: Obstetrics and Gynecology | Admitting: Obstetrics and Gynecology

## 2019-10-25 ENCOUNTER — Other Ambulatory Visit: Payer: Self-pay

## 2019-10-25 DIAGNOSIS — Z1239 Encounter for other screening for malignant neoplasm of breast: Secondary | ICD-10-CM

## 2019-10-25 DIAGNOSIS — Z1231 Encounter for screening mammogram for malignant neoplasm of breast: Secondary | ICD-10-CM

## 2019-11-01 ENCOUNTER — Ambulatory Visit: Payer: Medicare Other | Admitting: Obstetrics and Gynecology

## 2019-11-12 ENCOUNTER — Ambulatory Visit: Payer: Medicare Other

## 2019-11-15 ENCOUNTER — Ambulatory Visit: Payer: Medicare Other

## 2019-11-23 ENCOUNTER — Other Ambulatory Visit: Payer: Self-pay | Admitting: Internal Medicine

## 2019-11-23 DIAGNOSIS — R609 Edema, unspecified: Secondary | ICD-10-CM

## 2019-11-25 ENCOUNTER — Encounter: Payer: Self-pay | Admitting: *Deleted

## 2019-12-07 ENCOUNTER — Ambulatory Visit
Admission: RE | Admit: 2019-12-07 | Discharge: 2019-12-07 | Disposition: A | Discharge status: Home / Self Care | Payer: Medicare Other | Source: Ambulatory Visit | Attending: Internal Medicine | Admitting: Internal Medicine

## 2019-12-07 DIAGNOSIS — R609 Edema, unspecified: Secondary | ICD-10-CM

## 2020-01-03 ENCOUNTER — Ambulatory Visit: Payer: Medicare Other | Admitting: Thoracic Surgery (Cardiothoracic Vascular Surgery)

## 2020-05-11 IMAGING — CR DG CHEST 1V PORT
1 series · 1 of 1 positions shown · non-contrast
Comparison: 12/16/2018

CLINICAL DATA: Chest pain

EXAM:
PORTABLE CHEST 1 VIEW

[AP]
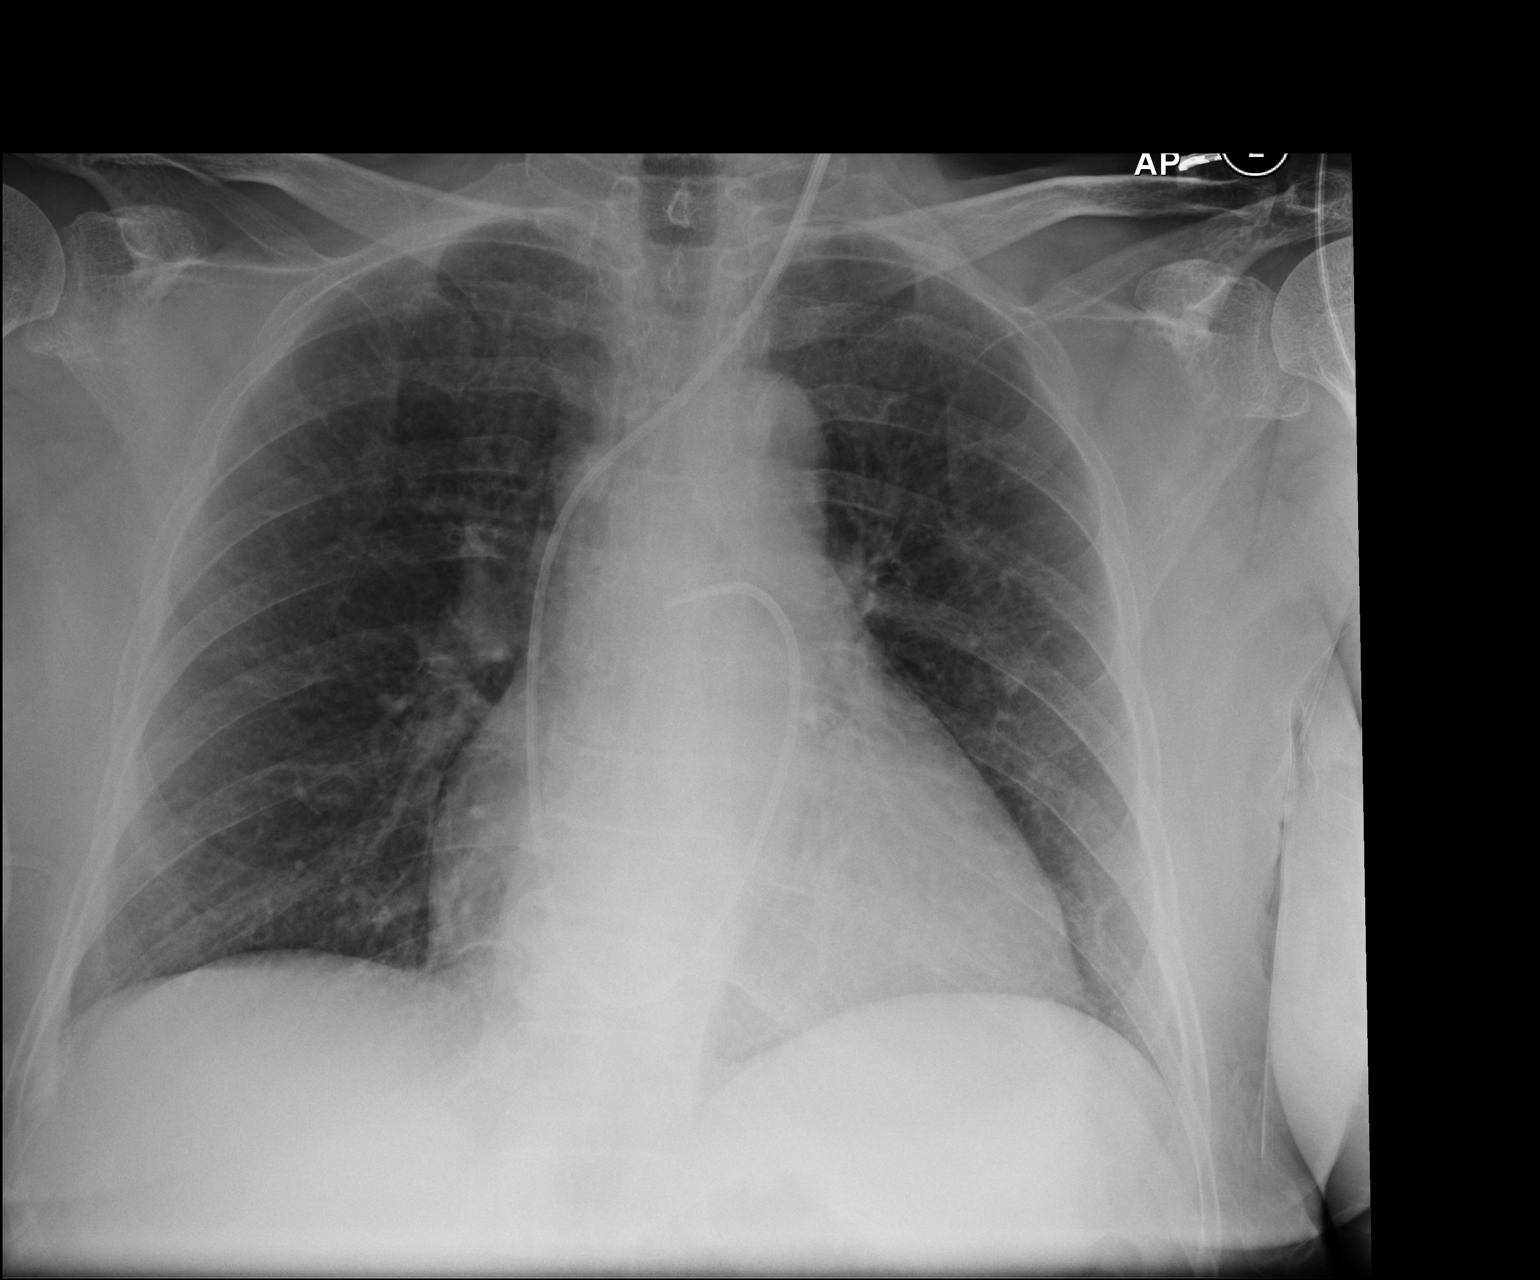

[1 of 1 positions shown; findings below may reference images not displayed]

FINDINGS: New Swan-Ganz catheter is noted within the right pulmonary outflow
tract. Cardiac shadow is prominent but accentuated by the portable
technique. The lungs are clear bilaterally. No pneumothorax is seen.
Postsurgical changes in the cervical spine are noted.
IMPRESSION: Swan-Ganz catheter placement as described. No pneumothorax is noted.

## 2020-05-13 IMAGING — DX DG CHEST 1V PORT
1 series · 1 of 1 positions shown · non-contrast
Comparison: 12/18/2018

CLINICAL DATA: Atelectasis.

EXAM:
PORTABLE CHEST 1 VIEW

[chest ap]
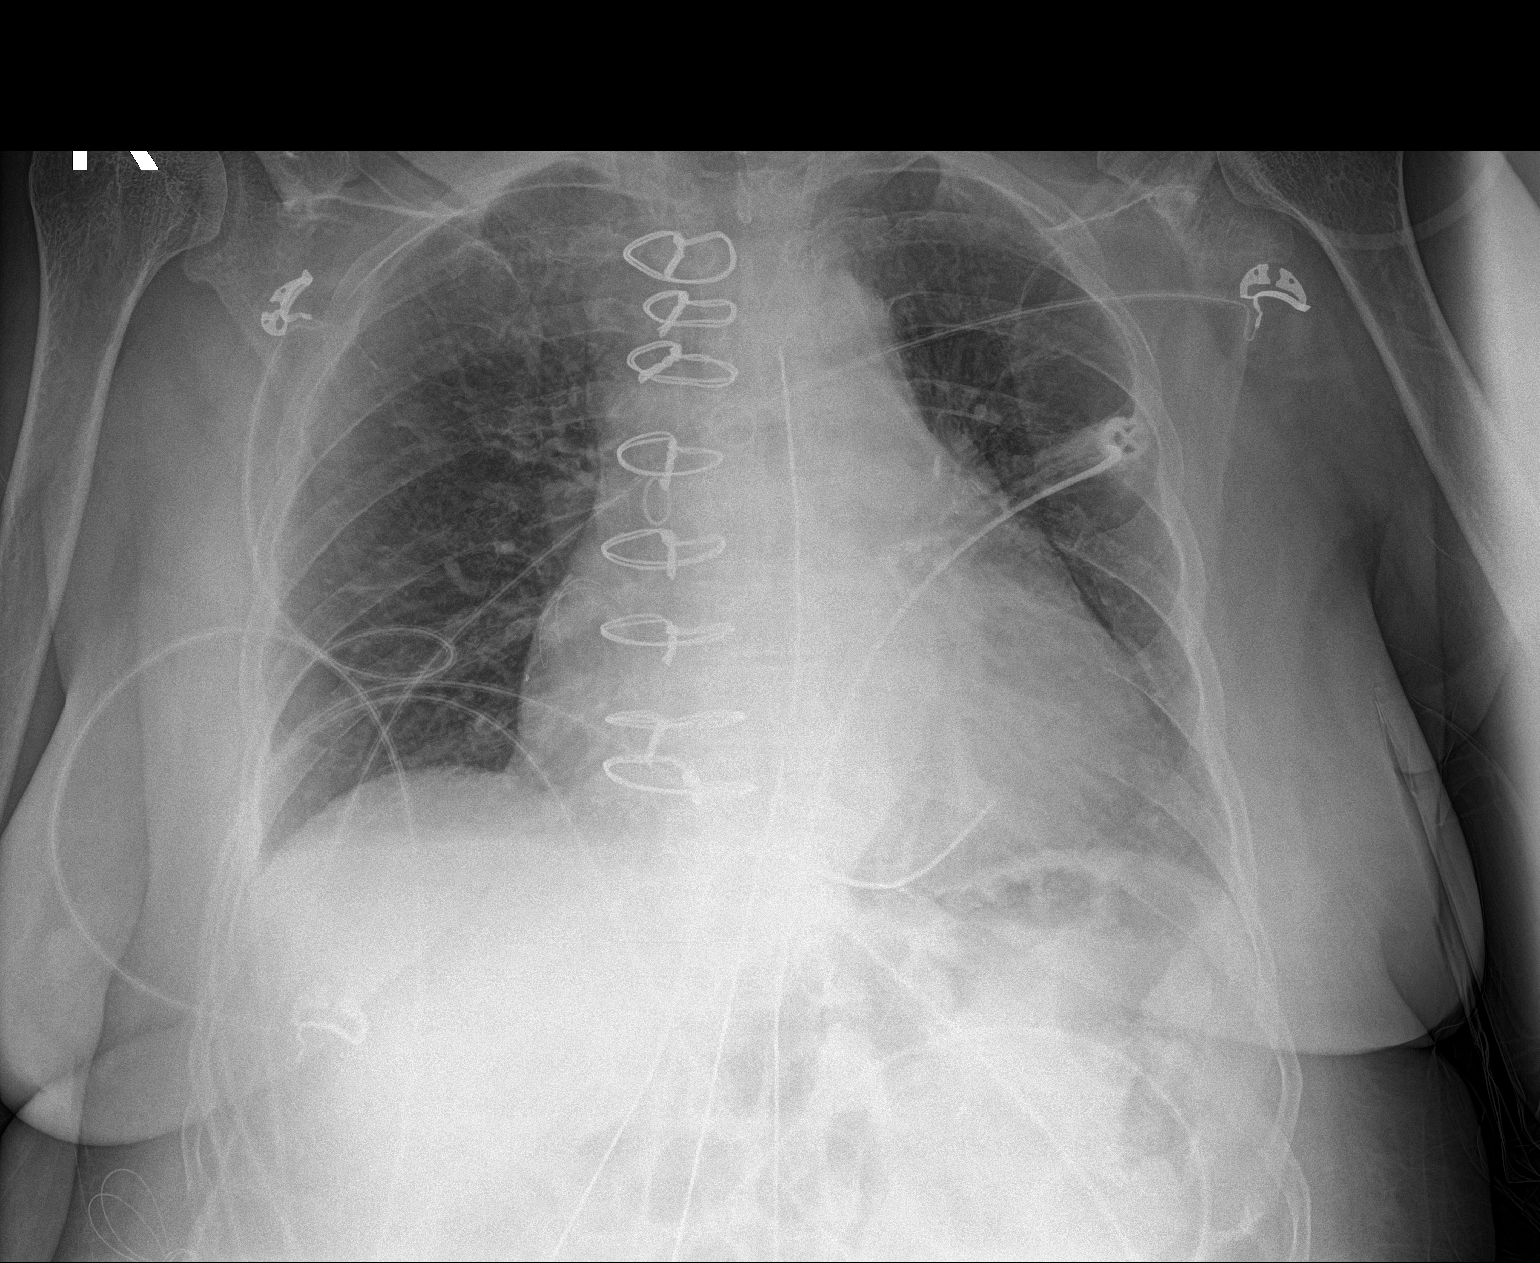

[1 of 1 positions shown; findings below may reference images not displayed]

FINDINGS: Left IJ central venous sheath is in place with tip over the left
brachiocephalic vein. Left basilar chest tube unchanged. Mediastinal
drain unchanged. Interval removal of additional left chest tube.

Lungs are adequately inflated without focal airspace consolidation
or effusion. No pneumothorax. Borderline cardiomegaly. Remainder of
the exam is unchanged.
IMPRESSION: No acute cardiopulmonary disease.

Tubes and lines as described.

## 2020-06-05 IMAGING — DX DG CHEST 2V
2 series · 2 of 2 positions shown · non-contrast
Comparison: Single-view of the chest 12/20/2018.

CLINICAL DATA: Patient status post CABG 12/17/2018. No current
complaints.

EXAM:
CHEST - 2 VIEW

[dg chest 2 view (1 of 2)]
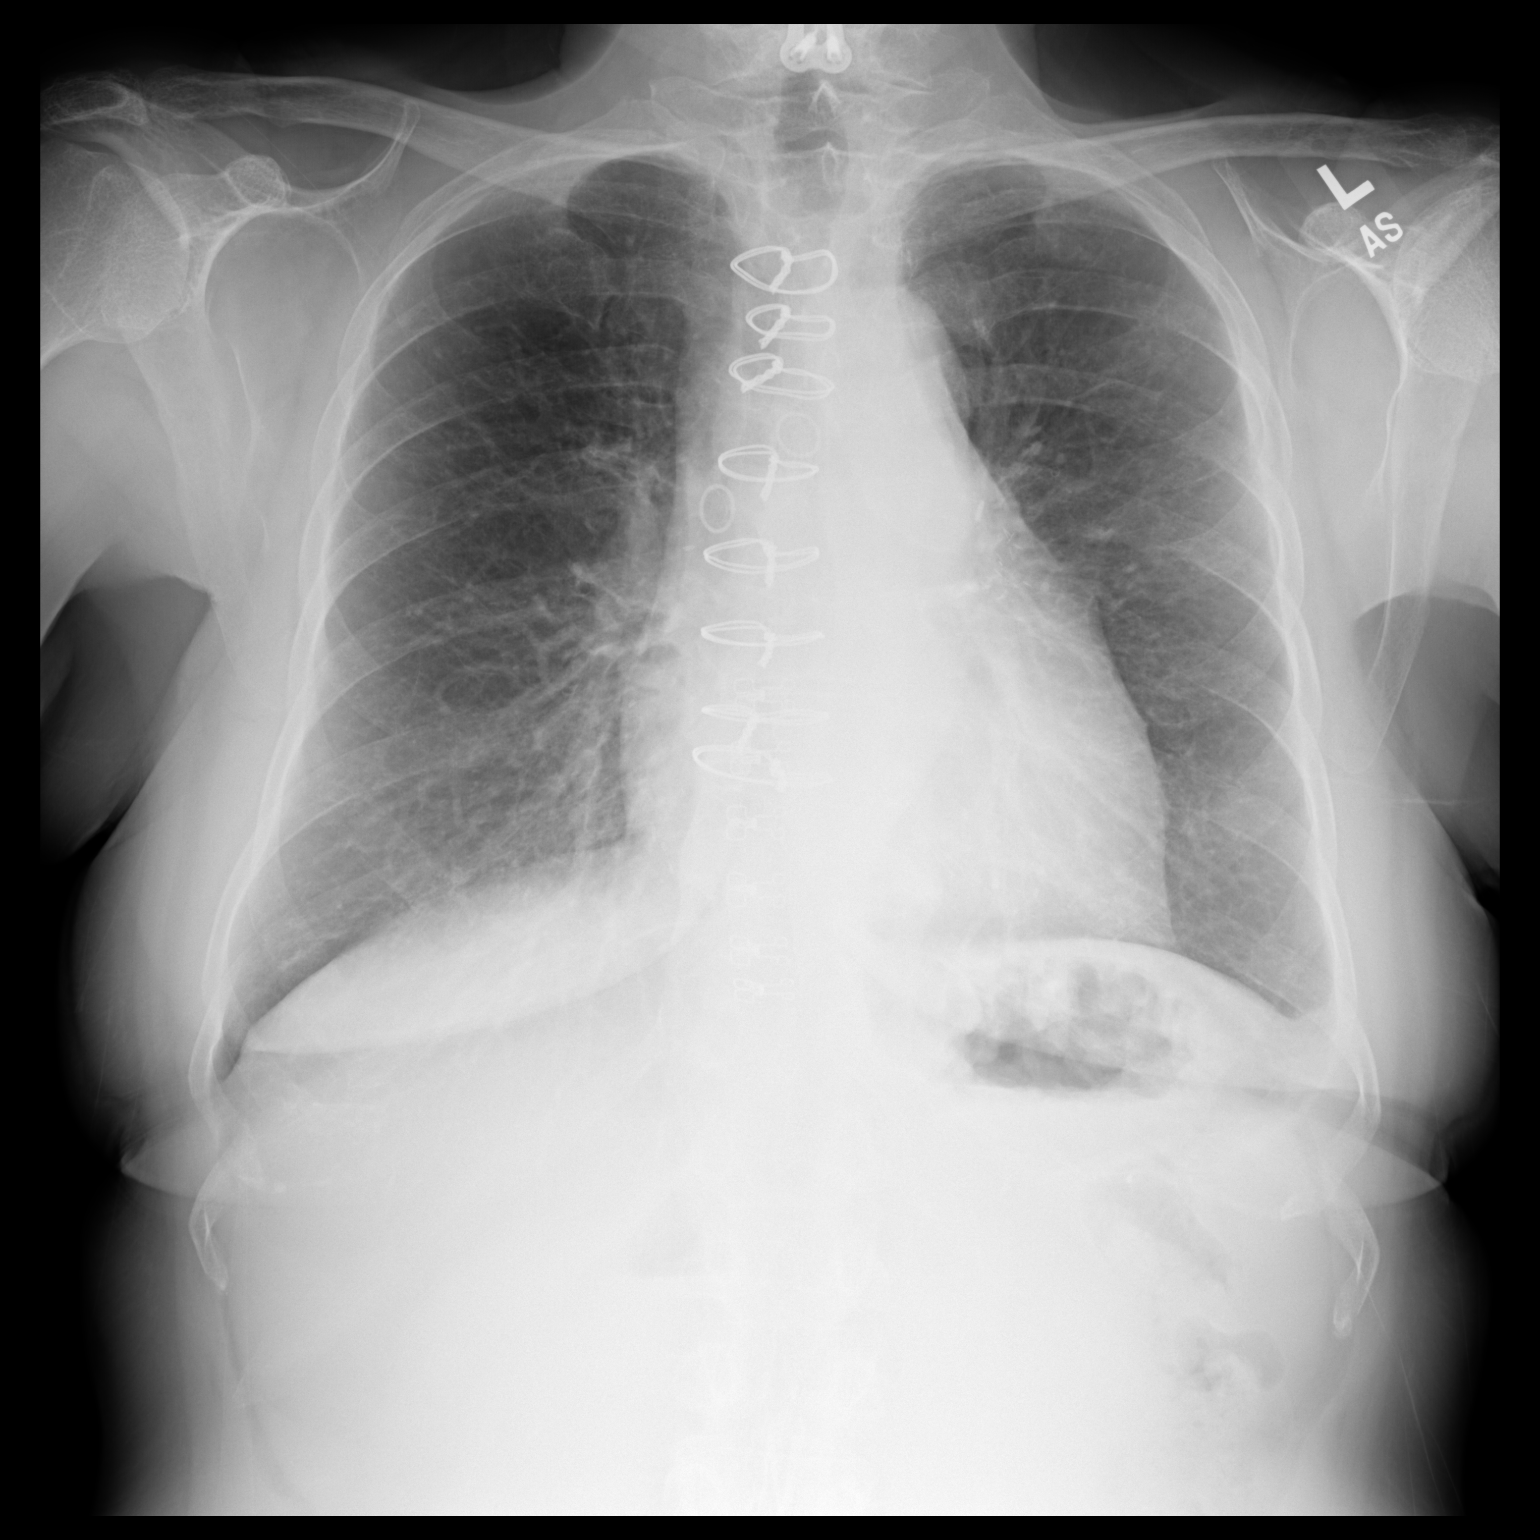

[dg chest 2 view (2 of 2)]
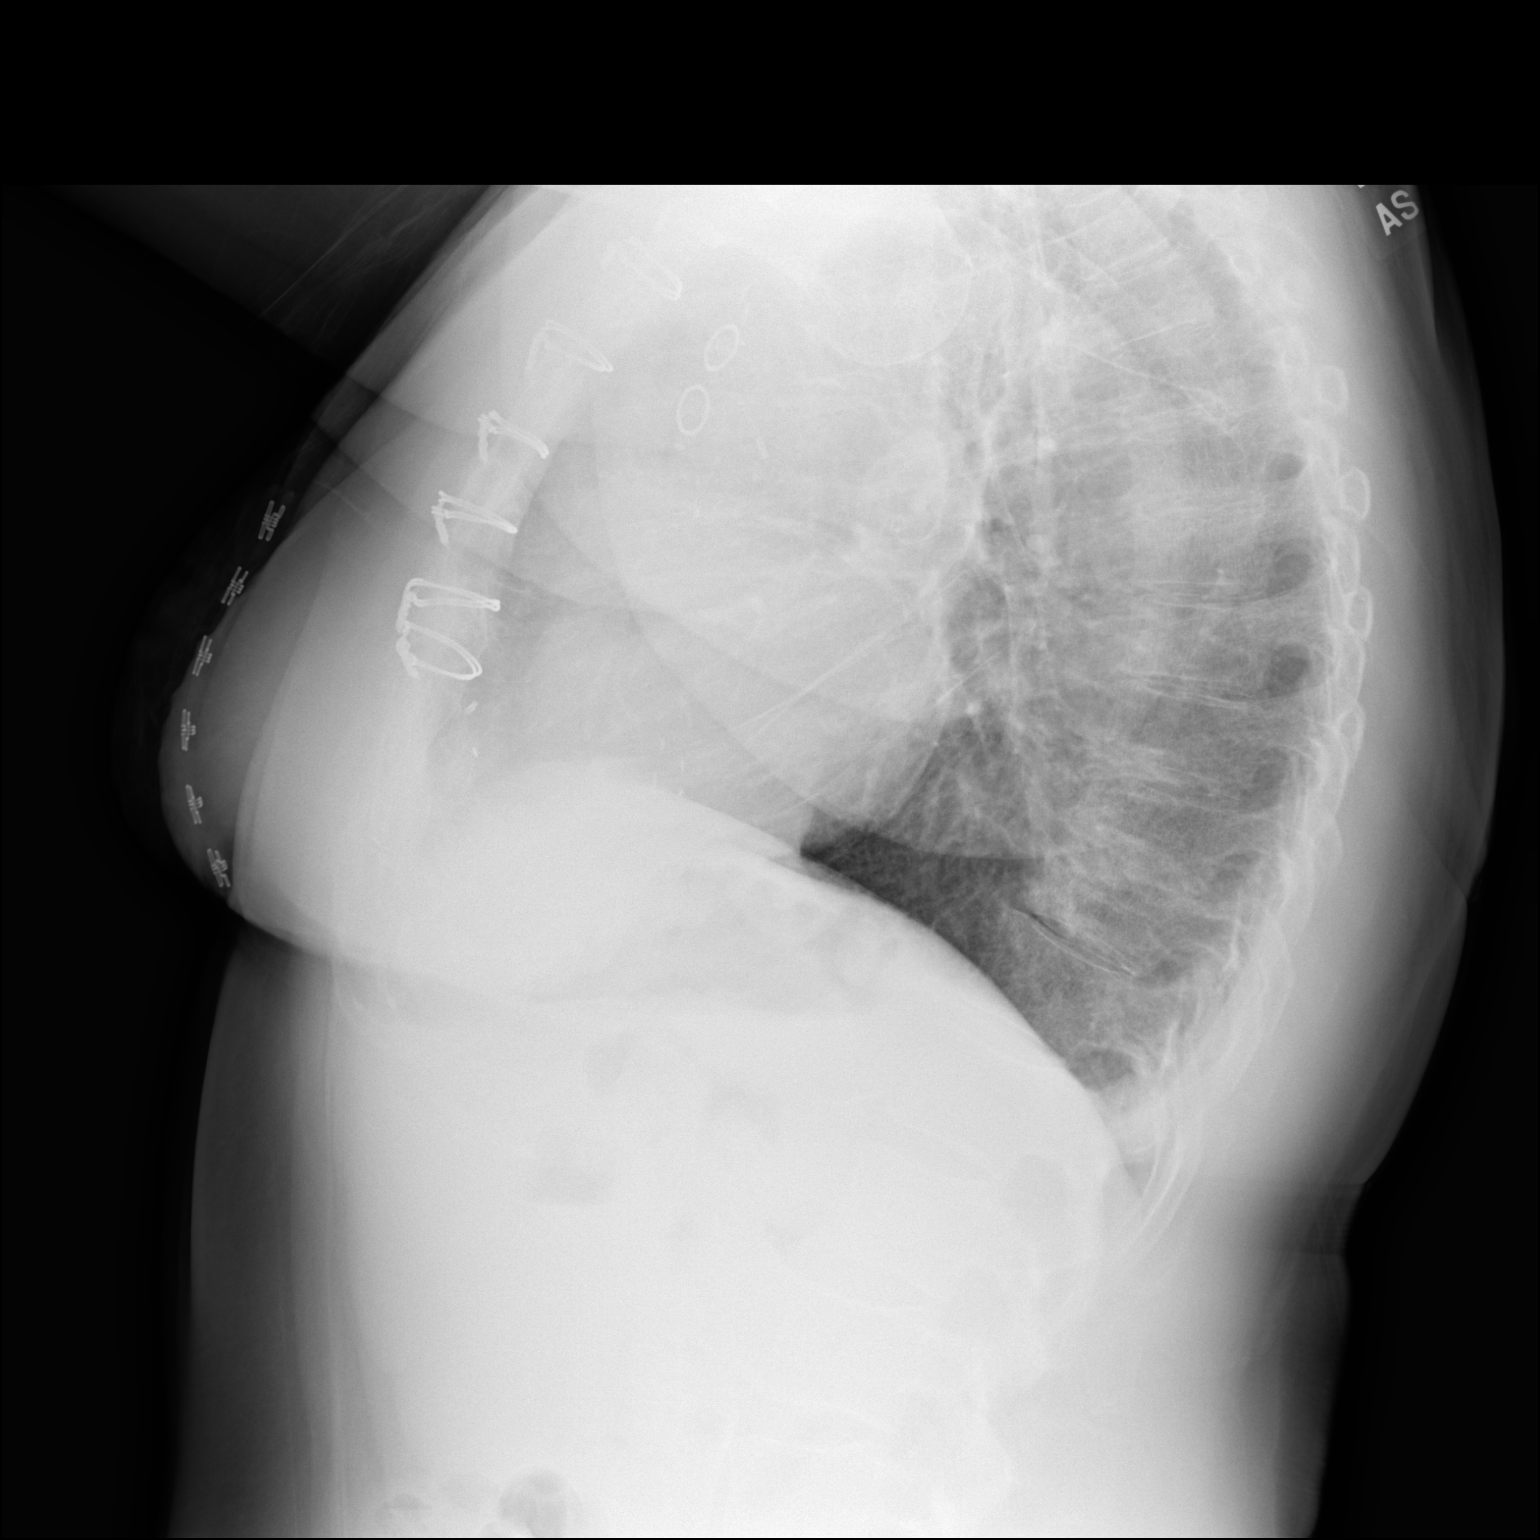

[2 of 2 positions shown; findings below may reference images not displayed]

FINDINGS: Median sternotomy wires are intact and unchanged. Lungs are clear.
Heart size is normal. No pneumothorax or pleural fluid. No acute or
focal bony abnormality.
IMPRESSION: No acute disease.
